# Patient Record
Sex: Female | Born: 1960 | Race: Black or African American | Hispanic: No | Smoking: Never smoker
Health system: Southern US, Community
[De-identification: ages and names within clinical notes are randomized; demographics above are authoritative.]

## PROBLEM LIST (undated history)

## (undated) DIAGNOSIS — E785 Hyperlipidemia, unspecified: Secondary | ICD-10-CM

## (undated) DIAGNOSIS — E119 Type 2 diabetes mellitus without complications: Secondary | ICD-10-CM

## (undated) DIAGNOSIS — I10 Essential (primary) hypertension: Secondary | ICD-10-CM

## (undated) DIAGNOSIS — I639 Cerebral infarction, unspecified: Secondary | ICD-10-CM

## (undated) HISTORY — PX: TUBAL LIGATION: SHX77

## (undated) HISTORY — DX: Essential (primary) hypertension: I10

## (undated) HISTORY — DX: Cerebral infarction, unspecified: I63.9

## (undated) HISTORY — DX: Type 2 diabetes mellitus without complications: E11.9

## (undated) HISTORY — DX: Hyperlipidemia, unspecified: E78.5

---

## 2000-10-19 ENCOUNTER — Encounter: Payer: Self-pay | Admitting: Family Medicine

## 2000-10-19 ENCOUNTER — Ambulatory Visit (HOSPITAL_COMMUNITY): Admission: RE | Admit: 2000-10-19 | Discharge: 2000-10-19 | Payer: Self-pay | Admitting: Family Medicine

## 2002-01-08 ENCOUNTER — Ambulatory Visit (HOSPITAL_COMMUNITY): Admission: RE | Admit: 2002-01-08 | Discharge: 2002-01-08 | Payer: Self-pay | Admitting: Unknown Physician Specialty

## 2002-01-08 ENCOUNTER — Encounter: Payer: Self-pay | Admitting: Family Medicine

## 2004-03-12 ENCOUNTER — Ambulatory Visit: Payer: Self-pay | Admitting: Family Medicine

## 2004-03-12 ENCOUNTER — Ambulatory Visit (HOSPITAL_COMMUNITY): Admission: RE | Admit: 2004-03-12 | Discharge: 2004-03-12 | Payer: Self-pay | Admitting: Family Medicine

## 2004-03-18 ENCOUNTER — Ambulatory Visit: Payer: Self-pay | Admitting: Family Medicine

## 2004-04-16 ENCOUNTER — Ambulatory Visit: Payer: Self-pay | Admitting: Family Medicine

## 2005-12-30 IMAGING — CR DG CHEST 2V
2 series · 2 of 2 positions shown · non-contrast
Comparison: none

HISTORY: Acute bronchitis, cough, congestion

CHEST 2 VIEWS:
No prior exam available for comparison
Normal heart size, mediastinal contours, and vascularity.
Minimal bronchitic changes.
Subsegmental atelectasis lingula.
No infiltrate, effusion, or pneumothorax.

[view not recorded (1 of 2)]
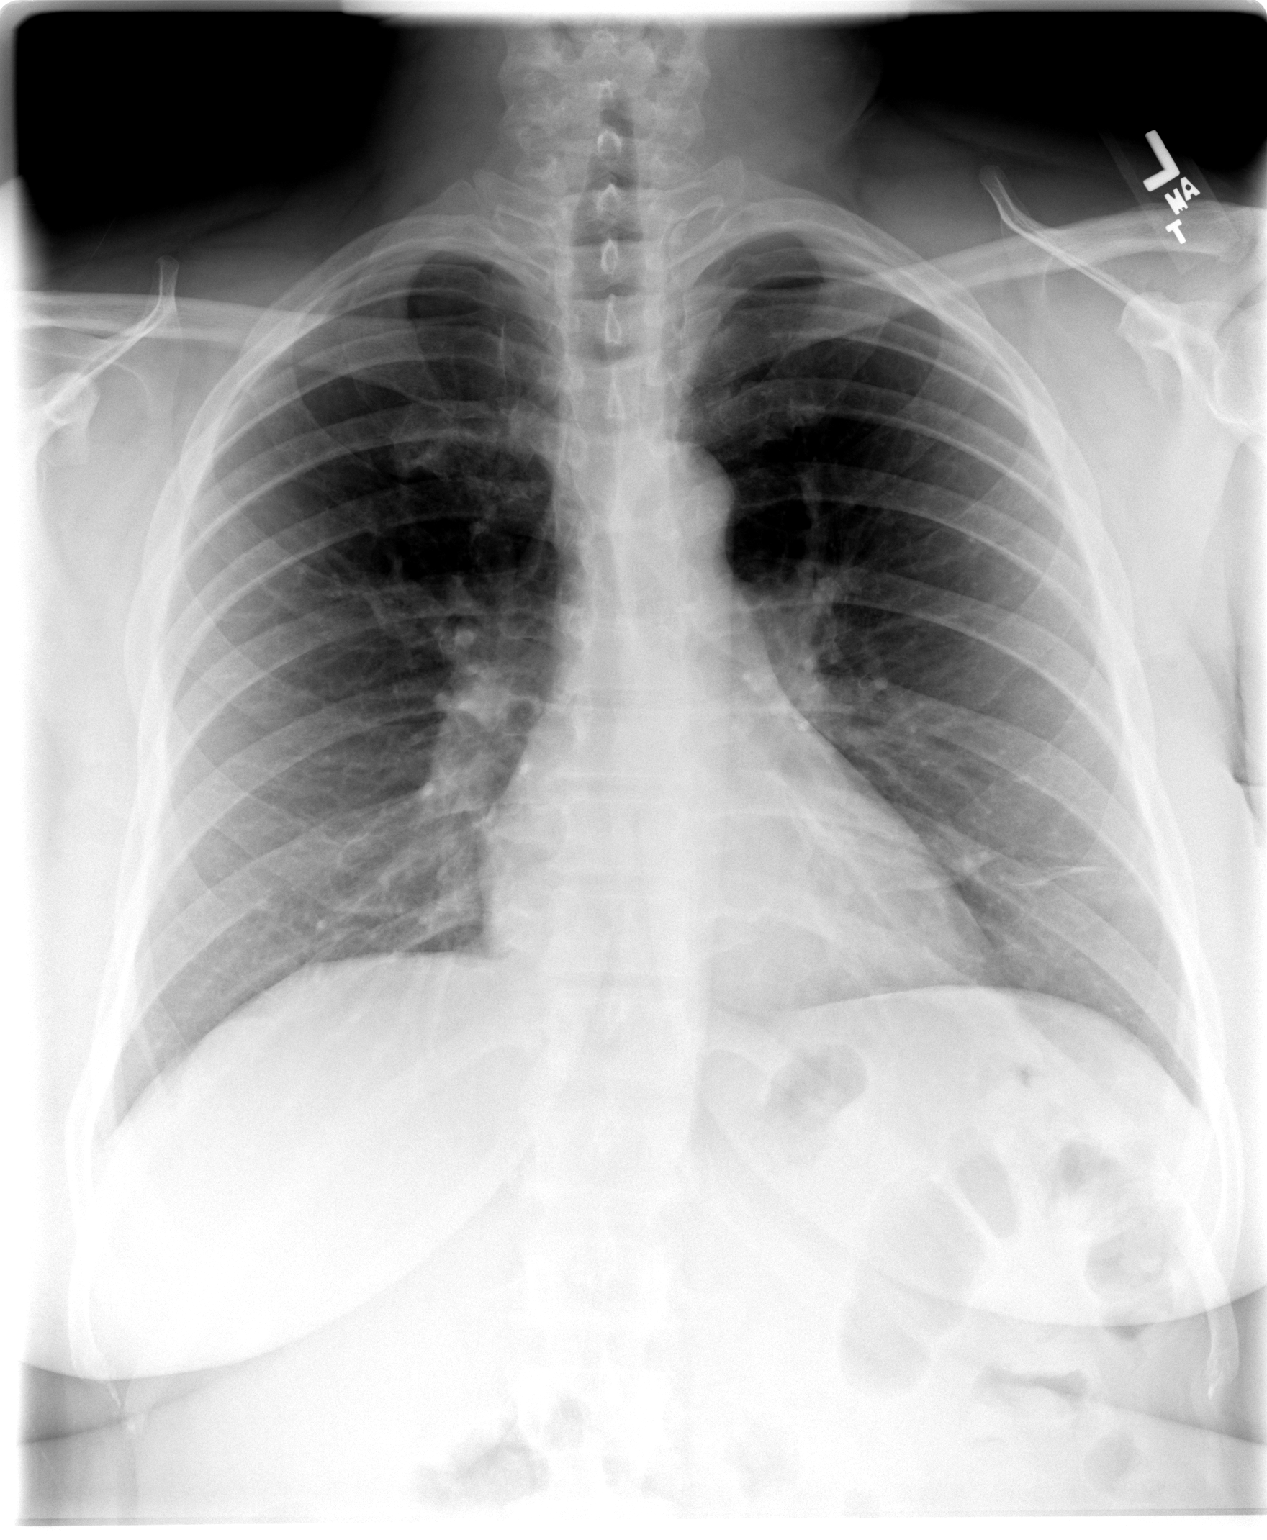

[view not recorded (2 of 2)]
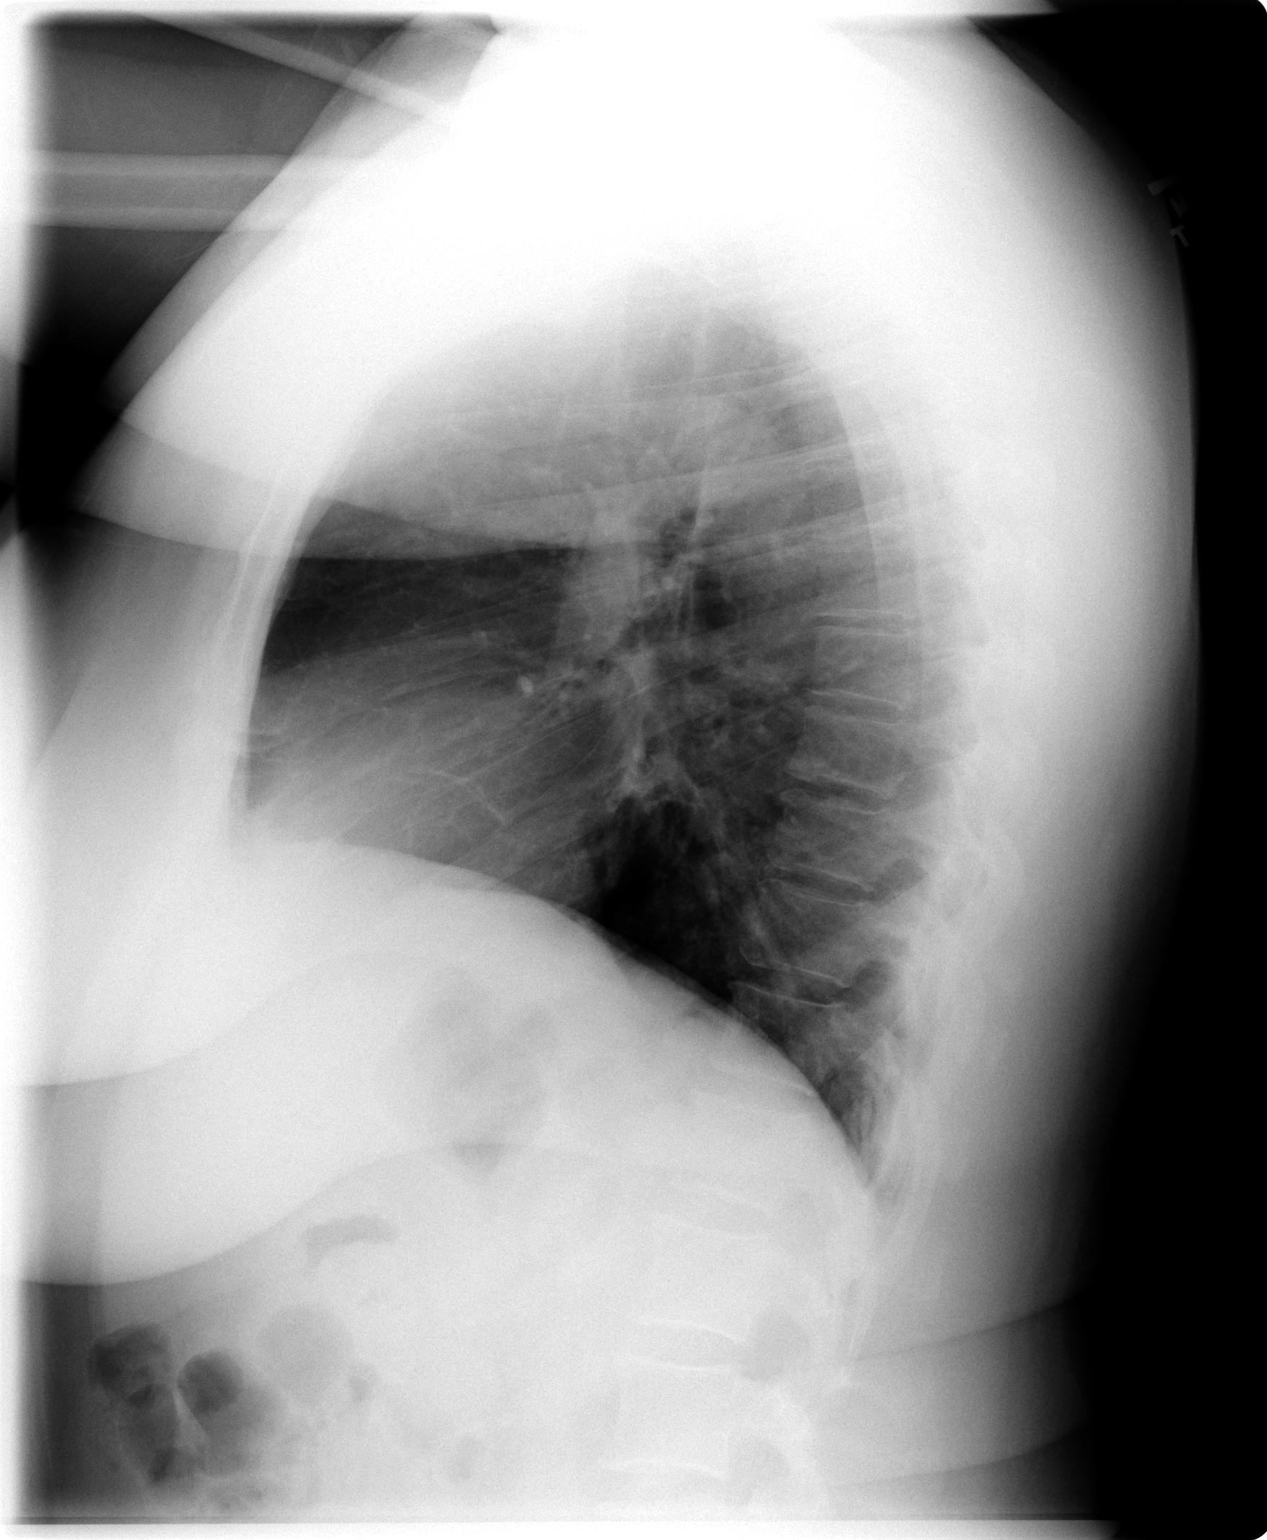

[2 of 2 positions shown; findings below may reference images not displayed]

IMPRESSION: Mild bronchitic changes with minimal subsegmental atelectasis lingula.

## 2007-01-19 ENCOUNTER — Encounter: Payer: Self-pay | Admitting: Family Medicine

## 2010-02-17 NOTE — Letter (Signed)
Summary: RPC chart  RPC chart   Imported By: Curtis Sites 08/26/2009 10:57:23  _____________________________________________________________________  External Attachment:    Type:   Image     Comment:   External Document

## 2020-03-12 DIAGNOSIS — E1165 Type 2 diabetes mellitus with hyperglycemia: Secondary | ICD-10-CM | POA: Diagnosis not present

## 2020-03-12 DIAGNOSIS — E1140 Type 2 diabetes mellitus with diabetic neuropathy, unspecified: Secondary | ICD-10-CM | POA: Diagnosis not present

## 2020-03-12 DIAGNOSIS — R4182 Altered mental status, unspecified: Secondary | ICD-10-CM | POA: Diagnosis not present

## 2020-03-12 DIAGNOSIS — H748X2 Other specified disorders of left middle ear and mastoid: Secondary | ICD-10-CM | POA: Diagnosis not present

## 2020-03-12 DIAGNOSIS — E8729 Other acidosis: Secondary | ICD-10-CM | POA: Insufficient documentation

## 2020-03-12 DIAGNOSIS — H538 Other visual disturbances: Secondary | ICD-10-CM | POA: Diagnosis not present

## 2020-03-12 DIAGNOSIS — H539 Unspecified visual disturbance: Secondary | ICD-10-CM | POA: Diagnosis not present

## 2020-03-12 DIAGNOSIS — R21 Rash and other nonspecific skin eruption: Secondary | ICD-10-CM | POA: Diagnosis not present

## 2020-03-12 DIAGNOSIS — R739 Hyperglycemia, unspecified: Secondary | ICD-10-CM | POA: Diagnosis not present

## 2020-03-12 DIAGNOSIS — I63239 Cerebral infarction due to unspecified occlusion or stenosis of unspecified carotid arteries: Secondary | ICD-10-CM | POA: Diagnosis not present

## 2020-03-12 DIAGNOSIS — E1340 Other specified diabetes mellitus with diabetic neuropathy, unspecified: Secondary | ICD-10-CM | POA: Diagnosis not present

## 2020-03-12 DIAGNOSIS — I10 Essential (primary) hypertension: Secondary | ICD-10-CM | POA: Diagnosis not present

## 2020-03-12 DIAGNOSIS — E872 Acidosis: Secondary | ICD-10-CM | POA: Insufficient documentation

## 2020-03-12 DIAGNOSIS — R29700 NIHSS score 0: Secondary | ICD-10-CM | POA: Diagnosis not present

## 2020-03-12 DIAGNOSIS — R42 Dizziness and giddiness: Secondary | ICD-10-CM | POA: Diagnosis not present

## 2020-03-12 DIAGNOSIS — E041 Nontoxic single thyroid nodule: Secondary | ICD-10-CM | POA: Diagnosis not present

## 2020-03-12 DIAGNOSIS — I639 Cerebral infarction, unspecified: Secondary | ICD-10-CM | POA: Diagnosis not present

## 2020-03-12 DIAGNOSIS — Z20822 Contact with and (suspected) exposure to covid-19: Secondary | ICD-10-CM | POA: Diagnosis not present

## 2020-03-13 DIAGNOSIS — R739 Hyperglycemia, unspecified: Secondary | ICD-10-CM | POA: Diagnosis not present

## 2020-03-13 DIAGNOSIS — E872 Acidosis: Secondary | ICD-10-CM | POA: Diagnosis not present

## 2020-03-13 DIAGNOSIS — I63331 Cerebral infarction due to thrombosis of right posterior cerebral artery: Secondary | ICD-10-CM | POA: Diagnosis not present

## 2020-03-13 DIAGNOSIS — I517 Cardiomegaly: Secondary | ICD-10-CM | POA: Diagnosis not present

## 2020-03-13 DIAGNOSIS — I5189 Other ill-defined heart diseases: Secondary | ICD-10-CM | POA: Diagnosis not present

## 2020-03-13 DIAGNOSIS — I10 Essential (primary) hypertension: Secondary | ICD-10-CM | POA: Diagnosis not present

## 2020-03-14 DIAGNOSIS — I639 Cerebral infarction, unspecified: Secondary | ICD-10-CM | POA: Diagnosis not present

## 2020-03-21 ENCOUNTER — Telehealth: Payer: Self-pay

## 2020-03-21 ENCOUNTER — Ambulatory Visit (INDEPENDENT_AMBULATORY_CARE_PROVIDER_SITE_OTHER): Payer: Federal, State, Local not specified - PPO | Admitting: Nurse Practitioner

## 2020-03-21 ENCOUNTER — Encounter: Payer: Self-pay | Admitting: Nurse Practitioner

## 2020-03-21 ENCOUNTER — Other Ambulatory Visit: Payer: Self-pay

## 2020-03-21 DIAGNOSIS — Z7689 Persons encountering health services in other specified circumstances: Secondary | ICD-10-CM

## 2020-03-21 DIAGNOSIS — I10 Essential (primary) hypertension: Secondary | ICD-10-CM | POA: Diagnosis not present

## 2020-03-21 DIAGNOSIS — E872 Acidosis: Secondary | ICD-10-CM | POA: Diagnosis not present

## 2020-03-21 DIAGNOSIS — Z139 Encounter for screening, unspecified: Secondary | ICD-10-CM

## 2020-03-21 DIAGNOSIS — I639 Cerebral infarction, unspecified: Secondary | ICD-10-CM

## 2020-03-21 DIAGNOSIS — E1169 Type 2 diabetes mellitus with other specified complication: Secondary | ICD-10-CM | POA: Insufficient documentation

## 2020-03-21 DIAGNOSIS — E1165 Type 2 diabetes mellitus with hyperglycemia: Secondary | ICD-10-CM | POA: Diagnosis not present

## 2020-03-21 MED ORDER — DAPAGLIFLOZIN PROPANEDIOL 10 MG PO TABS: 10.0000 mg | ORAL_TABLET | Freq: Every day | ORAL | 1 refills | Status: DC

## 2020-03-21 MED ORDER — CVS GLUCOSE METER TEST STRIPS VI STRP: ORAL_STRIP | 12 refills | Status: DC

## 2020-03-21 MED ORDER — ONETOUCH ULTRASOFT LANCETS MISC: 12 refills | Status: AC

## 2020-03-21 MED ORDER — BLOOD GLUCOSE METER KIT: PACK | 0 refills | Status: AC

## 2020-03-21 MED ORDER — LISINOPRIL 10 MG PO TABS: 10.0000 mg | ORAL_TABLET | Freq: Every day | ORAL | 3 refills | Status: DC

## 2020-03-21 MED ORDER — METFORMIN HCL 500 MG PO TABS: ORAL_TABLET | ORAL | 3 refills | Status: DC

## 2020-03-21 NOTE — Progress Notes (Signed)
New Patient Office Visit  Subjective:  Patient ID: Martha Jensen, female    DOB: 1960-10-29  Age: 60 y.o. MRN: [id number]  CC:  Chief Complaint  Patient presents with  . New Patient (Initial Visit)    Post CVA - 2/23    HPI Martha Jensen presents for new patient visit. No recent PCP. No recent physical or labs from PCP.  She was admitted to [facility] on 03/12/20 for subacute right occipital cortical infarct.  She was also found to have hyperglycemia.  She had SLP evaluation, and no further SLP therapy was required.  PT recommended 3x/wk therapy.  Her A1c was > 16% when drawn at [facility].  Past Medical History:  Diagnosis Date  . Diabetes mellitus without complication ([place])   . Stroke [facility])    Has blurred vision    History reviewed. No pertinent surgical history.  History reviewed. No pertinent family history.  Social History   Socioeconomic History  . Marital status: Divorced    Spouse name: Not on file  . Number of children: Not on file  . Years of education: Not on file  . Highest education level: Not on file  Occupational History  . Occupation: [employer] to [employer]  Tobacco Use  . Smoking status: Never Smoker  . Smokeless tobacco: Never Used  Substance and Sexual Activity  . Alcohol use: Never  . Drug use: Never  . Sexual activity: Not Currently  Other Topics Concern  . Not on file  Social History Narrative  . Not on file   Social Determinants of Health   Financial Resource Strain: Not on file  Food Insecurity: Not on file  Transportation Needs: Not on file  Physical Activity: Not on file  Stress: Not on file  Social Connections: Not on file  Intimate Partner Violence: Not on file    ROS Review of Systems  Constitutional: Negative.   Respiratory: Negative.   Cardiovascular: Negative.   Endocrine: Positive for polydipsia, polyphagia and polyuria.  Neurological: Negative for seizures, facial asymmetry,  weakness, numbness and headaches.       Vision changes after CVA  Psychiatric/Behavioral: Negative.     Objective:   Today's Vitals: BP (!) 175/127   Pulse 94   Temp 98.1 F (36.7 C)   Resp 20   Ht 5' 2"  (1.575 m)   Wt 212 lb (96.2 kg)   SpO2 98%   BMI 38.78 kg/m   Physical Exam Constitutional:      Appearance: She is obese.  Cardiovascular:     Rate and Rhythm: Normal rate and regular rhythm.     Pulses: Normal pulses.     Heart sounds: Normal heart sounds.  Pulmonary:     Effort: Pulmonary effort is normal.     Breath sounds: Normal breath sounds.  Musculoskeletal:        General: Normal range of motion.  Neurological:     General: No focal deficit present.     Mental Status: She is alert and oriented to person, place, and time.     Cranial Nerves: No cranial nerve deficit.     Comments: Vision changes after CVA     Assessment & Plan:   Problem List Items Addressed This Visit      Cardiovascular and Mediastinum   Hypertension, essential    -BP elevated today -Rx. lisinopril      Relevant Medications   aspirin 325 MG tablet   atorvastatin (LIPITOR) 80 MG  tablet   lisinopril (ZESTRIL) 10 MG tablet     Endocrine   Type II diabetes mellitus, uncontrolled ([place])    -referred to endocrinology -she was discharged without diabetic supplies despite her A1c > 16% -Rx. Meter, strips, lancets -CBG 278 today -Rx. farxiga -Rx. Metformin -taking lipitor and started on lisinopril today      Relevant Medications   aspirin 325 MG tablet   atorvastatin (LIPITOR) 80 MG tablet   blood glucose meter kit and supplies   metFORMIN (GLUCOPHAGE) 500 MG tablet   dapagliflozin propanediol (FARXIGA) 10 MG TABS tablet   lisinopril (ZESTRIL) 10 MG tablet   Other Relevant Orders   Ambulatory referral to Ophthalmology     Nervous and Auditory   Occipital infarction [facility])    -recent discharge from [facility] for CVA -has blurred vision; unsure if this is all CVA  related, or possibly related to her diabetes (A1c was > 16%) -referral to neurology, ophthalmology, PT, OT -has SLP eval while admitted, and there were no SLP deficits noted      Relevant Orders   Ambulatory referral to Ophthalmology   Ambulatory referral to Neurology   Ambulatory referral to Endocrinology   Ambulatory referral to Physical Therapy   Ambulatory referral to Occupational Therapy     Other   Encounter to establish care    -no recent PCP -briefly reviewed hospital notes from [facility]      Relevant Orders   CBC with Differential/Platelet   CMP14+EGFR   HCV Ab w/Rflx to Verification   Lipid Panel With LDL/HDL Ratio   Microalbumin / creatinine urine ratio   Screening due    -will screen for HCV with next set of labs      Relevant Orders   Lipid Panel With LDL/HDL Ratio      Outpatient Encounter Medications as of 03/21/2020  Medication Sig  . aspirin 325 MG tablet Take 325 mg by mouth daily.  Marland Kitchen atorvastatin (LIPITOR) 80 MG tablet Take 80 mg by mouth daily.  . blood glucose meter kit and supplies Dispense based on patient and insurance preference. Use up to four times daily as directed. (FOR ICD-10 E10.9, E11.9).  . dapagliflozin propanediol (FARXIGA) 10 MG TABS tablet Take 1 tablet (10 mg total) by mouth daily before breakfast.  . lisinopril (ZESTRIL) 10 MG tablet Take 1 tablet (10 mg total) by mouth daily.  . metFORMIN (GLUCOPHAGE) 500 MG tablet Take 1 tablet PO daily for 1 week, then take 1 tablet PO BID   No facility-administered encounter medications on file as of 03/21/2020.    Follow-up: Return in about 2 weeks (around 04/04/2020) for Physical exam.   Noreene Larsson, NP

## 2020-03-21 NOTE — Assessment & Plan Note (Signed)
-  BP elevated today -Rx. lisinopril

## 2020-03-21 NOTE — Telephone Encounter (Signed)
fmla forms received  Copied Noted Sleeved

## 2020-03-21 NOTE — Assessment & Plan Note (Addendum)
-  referred to endocrinology -she was discharged without diabetic supplies despite her A1c > 16% -Rx. Meter, strips, lancets -CBG 278 today -Rx. farxiga -Rx. Metformin -taking lipitor and started on lisinopril today

## 2020-03-21 NOTE — Assessment & Plan Note (Signed)
-  recent discharge from [facility] for CVA -has blurred vision; unsure if this is all CVA related, or possibly related to her diabetes (A1c was > 16%) -referral to neurology, ophthalmology, PT, OT -has SLP eval while admitted, and there were no SLP deficits noted

## 2020-03-21 NOTE — Assessment & Plan Note (Signed)
-  no recent PCP -briefly reviewed hospital notes from [facility]

## 2020-03-21 NOTE — Assessment & Plan Note (Signed)
-  will screen for HCV with next set of labs

## 2020-03-21 NOTE — Addendum Note (Signed)
Addended by: Lonn Georgia on: 03/21/2020 11:25 AM   Modules accepted: Orders

## 2020-03-21 NOTE — Patient Instructions (Signed)
We will meet back up in 2 weeks for a physical exam.  If you are fasting today, we can draw labs today. If you have eaten, we can drawn them at the time of your next appointment.  I started you on several medications today.    Lisinopril is for elevated BP. We don't want to decrease your BP too fast, so I started you on a small dose. This medication will also protect your kidneys from diabetes.  For diabetes, I started you on metformin and farxiga. The endocrinologist may change these medications to injectable insulin.  I also sent in a prescription for a meter, strips, and lancets for you to check your own blood sugar. You should check this once per day prior to seeing endocrinology.  Record your blood sugars and we will review them in 2 weeks.  I sent in referrals to PT, OT, endocrinology, neurology, and ophthalmology.

## 2020-03-23 LAB — CBC WITH DIFFERENTIAL/PLATELET
Basophils Absolute: 0 10*3/uL (ref 0.0–0.2)
Basos: 0 %
EOS (ABSOLUTE): 0.1 10*3/uL (ref 0.0–0.4)
Eos: 1 %
Hematocrit: 43.3 % (ref 34.0–46.6)
Hemoglobin: 13.8 g/dL (ref 11.1–15.9)
Immature Grans (Abs): 0 10*3/uL (ref 0.0–0.1)
Immature Granulocytes: 1 %
Lymphocytes Absolute: 2.3 10*3/uL (ref 0.7–3.1)
Lymphs: 34 %
MCH: 25.9 pg — ABNORMAL LOW (ref 26.6–33.0)
MCHC: 31.9 g/dL (ref 31.5–35.7)
MCV: 81 fL (ref 79–97)
Monocytes Absolute: 0.7 10*3/uL (ref 0.1–0.9)
Monocytes: 11 %
Neutrophils Absolute: 3.7 10*3/uL (ref 1.4–7.0)
Neutrophils: 53 %
Platelets: 234 10*3/uL (ref 150–450)
RBC: 5.32 x10E6/uL — ABNORMAL HIGH (ref 3.77–5.28)
RDW: 14.5 % (ref 11.7–15.4)
WBC: 6.8 10*3/uL (ref 3.4–10.8)

## 2020-03-23 LAB — LIPID PANEL WITH LDL/HDL RATIO
Cholesterol, Total: 162 mg/dL (ref 100–199)
HDL: 53 mg/dL (ref >39)
LDL Chol Calc (NIH): 93 mg/dL (ref 0–99)
LDL/HDL Ratio: 1.8 ratio (ref 0.0–3.2)
Triglycerides: 85 mg/dL (ref 0–149)
VLDL Cholesterol Cal: 16 mg/dL (ref 5–40)

## 2020-03-23 LAB — HCV INTERPRETATION

## 2020-03-23 LAB — CMP14+EGFR
ALT: 34 IU/L — ABNORMAL HIGH (ref 0–32)
AST: 28 IU/L (ref 0–40)
Albumin/Globulin Ratio: 1.5 (ref 1.2–2.2)
Albumin: 4.1 g/dL (ref 3.8–4.8)
Alkaline Phosphatase: 75 IU/L (ref 44–121)
BUN/Creatinine Ratio: 7 — ABNORMAL LOW (ref 12–28)
BUN: 6 mg/dL — ABNORMAL LOW (ref 8–27)
Bilirubin Total: 0.3 mg/dL (ref 0.0–1.2)
CO2: 22 mmol/L (ref 20–29)
Calcium: 9.3 mg/dL (ref 8.7–10.3)
Chloride: 100 mmol/L (ref 96–106)
Creatinine, Ser: 0.82 mg/dL (ref 0.57–1.00)
Globulin, Total: 2.7 g/dL (ref 1.5–4.5)
Glucose: 295 mg/dL — ABNORMAL HIGH (ref 65–99)
Potassium: 3.6 mmol/L (ref 3.5–5.2)
Sodium: 137 mmol/L (ref 134–144)
Total Protein: 6.8 g/dL (ref 6.0–8.5)
eGFR: 81 mL/min/{1.73_m2} (ref >59)

## 2020-03-23 LAB — MICROALBUMIN / CREATININE URINE RATIO
Creatinine, Urine: 190.7 mg/dL
Microalb/Creat Ratio: 38 mg/g creat — ABNORMAL HIGH (ref 0–29)
Microalbumin, Urine: 72.5 ug/mL

## 2020-03-23 LAB — HCV AB W/RFLX TO VERIFICATION: HCV Ab: <0.1 s/co ratio (ref 0.0–0.9)

## 2020-03-24 NOTE — Progress Notes (Signed)
Her blood sugar is elevated, but other than that labs look OK. We will discuss them at the appointment on 04/04/20.

## 2020-04-04 ENCOUNTER — Other Ambulatory Visit: Payer: Self-pay

## 2020-04-04 ENCOUNTER — Encounter (INDEPENDENT_AMBULATORY_CARE_PROVIDER_SITE_OTHER): Payer: Self-pay | Admitting: *Deleted

## 2020-04-04 ENCOUNTER — Ambulatory Visit (INDEPENDENT_AMBULATORY_CARE_PROVIDER_SITE_OTHER): Payer: Federal, State, Local not specified - PPO | Admitting: Nurse Practitioner

## 2020-04-04 ENCOUNTER — Encounter: Payer: Self-pay | Admitting: Nurse Practitioner

## 2020-04-04 VITALS — BP 175/109 | HR 100 | Temp 98.5°F | Resp 20 | Ht 62.0 in | Wt 210.0 lb

## 2020-04-04 DIAGNOSIS — Z23 Encounter for immunization: Secondary | ICD-10-CM

## 2020-04-04 DIAGNOSIS — Z139 Encounter for screening, unspecified: Secondary | ICD-10-CM

## 2020-04-04 DIAGNOSIS — E1165 Type 2 diabetes mellitus with hyperglycemia: Secondary | ICD-10-CM

## 2020-04-04 DIAGNOSIS — Z1231 Encounter for screening mammogram for malignant neoplasm of breast: Secondary | ICD-10-CM

## 2020-04-04 DIAGNOSIS — I639 Cerebral infarction, unspecified: Secondary | ICD-10-CM

## 2020-04-04 DIAGNOSIS — I10 Essential (primary) hypertension: Secondary | ICD-10-CM

## 2020-04-04 MED ORDER — LISINOPRIL 20 MG PO TABS: 20.0000 mg | ORAL_TABLET | Freq: Every day | ORAL | 1 refills | Status: DC

## 2020-04-04 NOTE — Patient Instructions (Addendum)
Please contact [facility] at [phone] to set up an appointment.  We will meet up in 2 weeks for BP check, and we will do the PAP smear that day.  We are trying to help with the transportation issue.

## 2020-04-04 NOTE — Assessment & Plan Note (Signed)
-  INCREASED lisinopril to 20 mg daily d/t elevated BP -if no improvement at next visit will consider amlodipine

## 2020-04-04 NOTE — Assessment & Plan Note (Signed)
-  has blurred vision still; could be related to diabetic eye changes -needs appt with ophthalmology, neuro, PT/OT

## 2020-04-04 NOTE — Progress Notes (Signed)
Established Patient Office Visit  Subjective:  Patient ID: Martha Jensen, female    DOB: December 02, 1960  Age: 60 y.o. MRN: [id number]  CC:  Chief Complaint  Patient presents with  . Annual Exam    HPI Martha Jensen presents for physical exam.  On 03/12/20, she was admitted to [facility]  for subacute right occipital cortical infarct.  She was also found to have hyperglycemia.  She had SLP evaluation, and no further SLP therapy was required.  PT recommended 3x/wk therapy. At her last OV, she was referred to ophthalmology, neuro, PT/OT. Pt refused PT and OT. Neurology called her 2x but she has not been scheduled. She states that she can't get to her appointments because she is unable to drive.  Her A1c was > 16% when drawn at [facility]. At her OV on 03/21/20, she was started on metformin and farxiga. Endocrinology says ready to be scheduled. She is having difficulty getting transportation to her appointments. She states that her blood sugars have run from 100-146, so that is much improved.  Past Medical History:  Diagnosis Date  . Diabetes mellitus without complication ([place])   . Stroke [facility])    Has blurred vision    History reviewed. No pertinent surgical history.  History reviewed. No pertinent family history.  Social History   Socioeconomic History  . Marital status: Divorced    Spouse name: Not on file  . Number of children: Not on file  . Years of education: Not on file  . Highest education level: Not on file  Occupational History  . Occupation: [employer] to [employer]  Tobacco Use  . Smoking status: Never Smoker  . Smokeless tobacco: Never Used  Substance and Sexual Activity  . Alcohol use: Never  . Drug use: Never  . Sexual activity: Not Currently  Other Topics Concern  . Not on file  Social History Narrative  . Not on file   Social Determinants of Health   Financial Resource Strain: Not on file  Food Insecurity: Not on file   Transportation Needs: Not on file  Physical Activity: Not on file  Stress: Not on file  Social Connections: Not on file  Intimate Partner Violence: Not on file    Outpatient Medications Prior to Visit  Medication Sig Dispense Refill  . aspirin 325 MG tablet Take 325 mg by mouth daily.    Marland Kitchen atorvastatin (LIPITOR) 80 MG tablet Take 80 mg by mouth daily.    . blood glucose meter kit and supplies Dispense based on patient and insurance preference. Use up to four times daily as directed. (FOR ICD-10 E10.9, E11.9). 1 each 0  . dapagliflozin propanediol (FARXIGA) 10 MG TABS tablet Take 1 tablet (10 mg total) by mouth daily before breakfast. 90 tablet 1  . glucose blood ([employer] GLUCOSE METER TEST STRIPS) test strip Use as instructed, up to 4 times per day 100 each 12  . Lancets (ONETOUCH ULTRASOFT) lancets Use as instructed, can use up to 4 times per day. 100 each 12  . metFORMIN (GLUCOPHAGE) 500 MG tablet Take 1 tablet PO daily for 1 week, then take 1 tablet PO BID 180 tablet 3  . lisinopril (ZESTRIL) 10 MG tablet Take 1 tablet (10 mg total) by mouth daily. 90 tablet 3   No facility-administered medications prior to visit.    No Known Allergies  ROS Review of Systems  Constitutional: Negative.   HENT: Negative.   Eyes: Positive for visual disturbance.  Blurred vision since CVA  Respiratory: Negative.   Cardiovascular: Negative.   Gastrointestinal: Negative.   Endocrine: Negative.   Genitourinary: Negative.   Musculoskeletal: Negative.   Skin: Negative.   Allergic/Immunologic: Negative.   Neurological: Negative.   Hematological: Negative.   Psychiatric/Behavioral: Negative.       Objective:    Physical Exam Constitutional:      Appearance: She is obese.  HENT:     Head: Normocephalic and atraumatic.     Right Ear: Tympanic membrane, ear canal and external ear normal.     Left Ear: Tympanic membrane, ear canal and external ear normal.     Nose: Nose normal.      Mouth/Throat:     Mouth: Mucous membranes are moist.     Pharynx: Oropharynx is clear.  Eyes:     Extraocular Movements: Extraocular movements intact.     Conjunctiva/sclera: Conjunctivae normal.     Pupils: Pupils are equal, round, and reactive to light.  Cardiovascular:     Rate and Rhythm: Normal rate and regular rhythm.     Pulses: Normal pulses.     Heart sounds: Normal heart sounds.  Pulmonary:     Effort: Pulmonary effort is normal.     Breath sounds: Normal breath sounds.  Abdominal:     General: Abdomen is flat. Bowel sounds are normal.     Palpations: Abdomen is soft.  Musculoskeletal:        General: Normal range of motion.     Cervical back: Normal range of motion and neck supple.  Skin:    General: Skin is warm and dry.     Capillary Refill: Capillary refill takes less than 2 seconds.  Neurological:     General: No focal deficit present.     Mental Status: She is alert and oriented to person, place, and time.     Cranial Nerves: No cranial nerve deficit.     Sensory: No sensory deficit.     Motor: No weakness.     Coordination: Coordination normal.     Gait: Gait normal.     Comments: Left DTR 1+ < R (2+)  Psychiatric:        Mood and Affect: Mood normal.        Behavior: Behavior normal.        Thought Content: Thought content normal.        Judgment: Judgment normal.     BP (!) 175/109   Pulse 100   Temp 98.5 F (36.9 C)   Resp 20   Ht _0  (1.575 m)   Wt 210 lb (95.3 kg)   SpO2 97%   BMI 38.41 kg/m  Wt Readings from Last 3 Encounters:  04/04/20 210 lb (95.3 kg)  03/21/20 212 lb (96.2 kg)     Health Maintenance Due  Topic Date Due  . PNEUMOCOCCAL POLYSACCHARIDE VACCINE AGE 9-64 HIGH RISK  Never done  . COVID-19 Vaccine (1) Never done  . FOOT EXAM  Never done  . OPHTHALMOLOGY EXAM  Never done  . HIV Screening  Never done  . TETANUS/TDAP  Never done  . PAP SMEAR-Modifier  Never done  . COLONOSCOPY (Pts 45-60yr Insurance coverage will  need to be confirmed)  Never done  . MAMMOGRAM  Never done    There are no preventive care reminders to display for this patient.  No results found for: TSH Lab Results  Component Value Date   WBC 6.8 03/21/2020   HGB 13.8 03/21/2020  HCT 43.3 03/21/2020   MCV 81 03/21/2020   PLT 234 03/21/2020   Lab Results  Component Value Date   NA 137 03/21/2020   K 3.6 03/21/2020   CO2 22 03/21/2020   GLUCOSE 295 (H) 03/21/2020   BUN 6 (L) 03/21/2020   CREATININE 0.82 03/21/2020   BILITOT 0.3 03/21/2020   ALKPHOS 75 03/21/2020   AST 28 03/21/2020   ALT 34 (H) 03/21/2020   PROT 6.8 03/21/2020   ALBUMIN 4.1 03/21/2020   CALCIUM 9.3 03/21/2020   Lab Results  Component Value Date   CHOL 162 03/21/2020   Lab Results  Component Value Date   HDL 53 03/21/2020   Lab Results  Component Value Date   LDLCALC 93 03/21/2020   Lab Results  Component Value Date   TRIG 85 03/21/2020   No results found for: CHOLHDL No results found for: HGBA1C    Assessment & Plan:   Problem List Items Addressed This Visit      Cardiovascular and Mediastinum   Hypertension, essential    -INCREASED lisinopril to 20 mg daily d/t elevated BP -if no improvement at next visit will consider amlodipine      Relevant Medications   lisinopril (ZESTRIL) 20 MG tablet     Endocrine   Type II diabetes mellitus, uncontrolled ([place])    -she reports home blood sugars generally around 100, no readings above 150 -this is well controlled per her home readings -continue farxiga and metformin      Relevant Medications   lisinopril (ZESTRIL) 20 MG tablet     Nervous and Auditory   Occipital infarction ([place])    -has blurred vision still; could be related to diabetic eye changes -needs appt with ophthalmology, neuro, PT/OT         Other   Screening due   Relevant Orders   Ambulatory referral to Gastroenterology    Other Visit Diagnoses    Screening mammogram for breast cancer    -  Primary    Relevant Orders   MM Digital Screening      Meds ordered this encounter  Medications  . lisinopril (ZESTRIL) 20 MG tablet    Sig: Take 1 tablet (20 mg total) by mouth daily.    Dispense:  30 tablet    Refill:  1    Follow-up: Return in about 2 weeks (around 04/18/2020) for BP check.    Noreene Larsson, NP

## 2020-04-04 NOTE — Addendum Note (Signed)
Addended by: Lonn Georgia on: 04/04/2020 11:13 AM   Modules accepted: Orders

## 2020-04-04 NOTE — Assessment & Plan Note (Signed)
-  she reports home blood sugars generally around 100, no readings above 150 -this is well controlled per her home readings -continue farxiga and metformin

## 2020-04-06 DIAGNOSIS — I10 Essential (primary) hypertension: Secondary | ICD-10-CM

## 2020-04-07 ENCOUNTER — Encounter: Payer: Self-pay | Admitting: Nurse Practitioner

## 2020-04-07 NOTE — Telephone Encounter (Signed)
COMPLETE

## 2020-04-08 ENCOUNTER — Encounter: Payer: Self-pay | Admitting: Neurology

## 2020-04-08 ENCOUNTER — Ambulatory Visit: Payer: Federal, State, Local not specified - PPO | Admitting: "Endocrinology

## 2020-04-08 ENCOUNTER — Ambulatory Visit: Payer: Federal, State, Local not specified - PPO | Admitting: Neurology

## 2020-04-08 VITALS — BP 150/95 | HR 85 | Ht 62.0 in | Wt 212.2 lb

## 2020-04-08 DIAGNOSIS — H539 Unspecified visual disturbance: Secondary | ICD-10-CM | POA: Diagnosis not present

## 2020-04-08 DIAGNOSIS — I639 Cerebral infarction, unspecified: Secondary | ICD-10-CM | POA: Diagnosis not present

## 2020-04-08 MED ORDER — ASPIRIN EC 81 MG PO TBEC: 81.0000 mg | DELAYED_RELEASE_TABLET | Freq: Every day | ORAL | 11 refills | Status: DC

## 2020-04-08 NOTE — Patient Instructions (Signed)
I had a long d/w patient and her daughter about her recent cryptogenic stroke and occipital infarct, risk for recurrent stroke/TIAs, personally independently reviewed imaging studies and stroke evaluation results and answered questions.Continue aspirin but reduce the dose to 81 mg daily for secondary stroke prevention and maintain strict control of hypertension with blood pressure goal below 130/90, diabetes with hemoglobin A1c goal below 6.5% and lipids with LDL cholesterol goal below 70 mg/dL. I also advised the patient to eat a healthy diet with plenty of whole grains, cereals, fruits and vegetables, exercise regularly and maintain ideal body weight .check follow-up lipid profile, hemoglobin A1c, TSH, B12 and vitamin D levels.  Recommend outpatient TEE and if negative loop recorder for paroxysmal A. fib.  Also check outpatient TCD bubble study for PFO.  Patient may also consider possible participation in the [place] trial for stroke prevention and will be given information to review and decide.  I also advised her not to drive till her peripheral vision loss improves.  Followup in the future with me in 3 months or call earlier if necessary  Stroke Prevention Some medical conditions and behaviors are associated with a higher chance of having a stroke. You can help prevent a stroke by making nutrition, lifestyle, and other changes, including managing any medical conditions you may have. What nutrition changes can be made?  Eat healthy foods. You can do this by: ? Choosing foods high in fiber, such as fresh fruits and vegetables and whole grains. ? Eating at least 5 or more servings of fruits and vegetables a day. Try to fill half of your plate at each meal with fruits and vegetables. ? Choosing lean protein foods, such as lean cuts of meat, poultry without skin, fish, tofu, beans, and nuts. ? Eating low-fat dairy products. ? Avoiding foods that are high in salt (sodium). This can help lower blood  pressure. ? Avoiding foods that have saturated fat, trans fat, and cholesterol. This can help prevent high cholesterol. ? Avoiding processed and premade foods.  Follow your health care provider's specific guidelines for losing weight, controlling high blood pressure (hypertension), lowering high cholesterol, and managing diabetes. These may include: ? Reducing your daily calorie intake. ? Limiting your daily sodium intake to 1,500 milligrams (mg). ? Using only healthy fats for cooking, such as olive oil, canola oil, or sunflower oil. ? Counting your daily carbohydrate intake.   What lifestyle changes can be made?  Maintain a healthy weight. Talk to your health care provider about your ideal weight.  Get at least 30 minutes of moderate physical activity at least 5 days a week. Moderate activity includes brisk walking, biking, and swimming.  Do not use any products that contain nicotine or tobacco, such as cigarettes and e-cigarettes. If you need help quitting, ask your health care provider. It may also be helpful to avoid exposure to secondhand smoke.  Limit alcohol intake to no more than 1 drink a day for nonpregnant women and 2 drinks a day for men. One drink equals 12 oz of beer, 5 oz of wine, or 1 oz of hard liquor.  Stop any illegal drug use.  Avoid taking birth control pills. Talk to your health care provider about the risks of taking birth control pills if: ? You are over 33 years old. ? You smoke. ? You get migraines. ? You have ever had a blood clot. What other changes can be made?  Manage your cholesterol levels. ? Eating a healthy diet is important for  preventing high cholesterol. If cholesterol cannot be managed through diet alone, you may also need to take medicines. ? Take any prescribed medicines to control your cholesterol as told by your health care provider.  Manage your diabetes. ? Eating a healthy diet and exercising regularly are important parts of managing your  blood sugar. If your blood sugar cannot be managed through diet and exercise, you may need to take medicines. ? Take any prescribed medicines to control your diabetes as told by your health care provider.  Control your hypertension. ? To reduce your risk of stroke, try to keep your blood pressure below 130/80. ? Eating a healthy diet and exercising regularly are an important part of controlling your blood pressure. If your blood pressure cannot be managed through diet and exercise, you may need to take medicines. ? Take any prescribed medicines to control hypertension as told by your health care provider. ? Ask your health care provider if you should monitor your blood pressure at home. ? Have your blood pressure checked every year, even if your blood pressure is normal. Blood pressure increases with age and some medical conditions.  Get evaluated for sleep disorders (sleep apnea). Talk to your health care provider about getting a sleep evaluation if you snore a lot or have excessive sleepiness.  Take over-the-counter and prescription medicines only as told by your health care provider. Aspirin or blood thinners (antiplatelets or anticoagulants) may be recommended to reduce your risk of forming blood clots that can lead to stroke.  Make sure that any other medical conditions you have, such as atrial fibrillation or atherosclerosis, are managed. What are the warning signs of a stroke? The warning signs of a stroke can be easily remembered as BEFAST.  B is for balance. Signs include: ? Dizziness. ? Loss of balance or coordination. ? Sudden trouble walking.  E is for eyes. Signs include: ? A sudden change in vision. ? Trouble seeing.  F is for face. Signs include: ? Sudden weakness or numbness of the face. ? The face or eyelid drooping to one side.  A is for arms. Signs include: ? Sudden weakness or numbness of the arm, usually on one side of the body.  S is for speech. Signs  include: ? Trouble speaking (aphasia). ? Trouble understanding.  T is for time. ? These symptoms may represent a serious problem that is an emergency. Do not wait to see if the symptoms will go away. Get medical help right away. Call your local emergency services (911 in the [place] Do not drive yourself to the hospital.  Other signs of stroke may include: ? A sudden, severe headache with no known cause. ? Nausea or vomiting. ? Seizure. Where to find more information For more information, visit:  American Stroke Association: www.strokeassociation.org  National Stroke Association: www.stroke.org Summary  You can prevent a stroke by eating healthy, exercising, not smoking, limiting alcohol intake, and managing any medical conditions you may have.  Do not use any products that contain nicotine or tobacco, such as cigarettes and e-cigarettes. If you need help quitting, ask your health care provider. It may also be helpful to avoid exposure to secondhand smoke.  Remember BEFAST for warning signs of stroke. Get help right away if you or a loved one has any of these signs. This information is not intended to replace advice given to you by your health care provider. Make sure you discuss any questions you have with your health care provider. Document Revised: 12/17/2016  Document Reviewed: 02/10/2016 Elsevier Patient Education  [place].

## 2020-04-08 NOTE — Progress Notes (Signed)
[facility] [address] [place]. [place] [address] [phone]       OFFICE CONSULT NOTE  Martha Jensen Date of Birth:  12/19/1960 Medical Record Number:  [id number]   Referring MD: Demetrius Revel, NP  Reason for Referral: Stroke HPI: Martha Jensen is a 60 year old African-American lady seen today for initial office consultation visit for stroke.  History is obtained from the patient and review of electronic medical records in [place].  I reviewed pertinent imaging results the actual films were not available for review today.  She has past medical history for diabetes, hypertension, hyperlipidemia who was admitted to [facility] on 03/12/2020 with sudden onset of visual disturbance which she described as disco lights type of vision with her body summitted restlessly being on fire from the armpits to the bottom of the toes.  These episodes lasted a few minutes to a maximum of 10 minutes and occurred several times.  She denied any accompanying headache slurred speech extremity weakness numbness.  She had an MRI scan of the brain done which showed right occipital cortical infarct.  CT angiogram of the brain and neck were done which showed no significant large vessel intracranial extracranial stenosis.  2D echo showed ejection fraction of 65 to 70% without cardiac source of embolism.  LDL cholesterol is elevated 169 mg percent.  Hemoglobin A1c report could not be obtained by me but the patient told me it was elevated at 16.  She was started on aspirin 325 mg daily as well as Lipitor 80 mg.  She states the visual disturbances have now resolved but she has noticed slight peripheral vision loss on the left which has persisted.  Patient had Covid infection in January 28 and had just gone to work 10 days later when she started not feeling well and her symptoms gradually worsened then she developed his visual symptoms.  Patient is currently tolerating aspirin well without  upset stomach, bruising or bleeding.  Her blood pressure has been difficult to control and primary care physician increase her medications yesterday and yet it is 150/95 today in my office.  Her fasting sugars are doing better now medication change.  She is tolerating Lipitor well without muscle aches and pains.  She denies any prior history of strokes TIA seizures migraines or significant neurological problems.  There is no family history of stroke.  ROS:   14 system review of systems is positive for vision difficulties, warmth feeling, blurred vision and all other systems negative  PMH:  Past Medical History:  Diagnosis Date  . Diabetes mellitus without complication ([place])   . Stroke [facility])    Has blurred vision    Social History:  Social History   Socioeconomic History  . Marital status: Divorced    Spouse name: Not on file  . Number of children: Not on file  . Years of education: Not on file  . Highest education level: Not on file  Occupational History  . Occupation: [employer]    Comment: full time  Tobacco Use  . Smoking status: Never Smoker  . Smokeless tobacco: Never Used  Substance and Sexual Activity  . Alcohol use: Never  . Drug use: Never  . Sexual activity: Not Currently  Other Topics Concern  . Not on file  Social History Narrative   Lives alone   Right Handed   Drinks caffeine 3xweek   Social Determinants of Health   Financial Resource Strain: Not on  file  Food Insecurity: Not on file  Transportation Needs: Not on file  Physical Activity: Not on file  Stress: Not on file  Social Connections: Not on file  Intimate Partner Violence: Not on file    Medications:   Current Outpatient Medications on File Prior to Visit  Medication Sig Dispense Refill  . atorvastatin (LIPITOR) 80 MG tablet Take 80 mg by mouth daily.    . blood glucose meter kit and supplies Dispense based on patient and insurance preference. Use up to  four times daily as directed. (FOR ICD-10 E10.9, E11.9). 1 each 0  . dapagliflozin propanediol (FARXIGA) 10 MG TABS tablet Take 1 tablet (10 mg total) by mouth daily before breakfast. 90 tablet 1  . glucose blood ([employer] GLUCOSE METER TEST STRIPS) test strip Use as instructed, up to 4 times per day 100 each 12  . Lancets (ONETOUCH ULTRASOFT) lancets Use as instructed, can use up to 4 times per day. 100 each 12  . lisinopril (ZESTRIL) 20 MG tablet Take 1 tablet (20 mg total) by mouth daily. 30 tablet 1  . metFORMIN (GLUCOPHAGE) 500 MG tablet Take 1 tablet PO daily for 1 week, then take 1 tablet PO BID 180 tablet 3   No current facility-administered medications on file prior to visit.    Allergies:  No Known Allergies  Physical Exam General: Mildly obese middle-aged African-American lady, seated, in no evident distress Head: head normocephalic and atraumatic.   Neck: supple with no carotid or supraclavicular bruits Cardiovascular: regular rate and rhythm, no murmurs Musculoskeletal: no deformity Skin:  no rash/petichiae Vascular:  Normal pulses all extremities  Neurologic Exam Mental Status: Awake and fully alert. Oriented to place and time. Recent and remote memory intact. Attention span, concentration and fund of knowledge appropriate. Mood and affect appropriate.  Diminished recall 2/3.  Able to name only 8 animals which can walk on 4 legs. Cranial Nerves: Fundoscopic exam reveals sharp disc margins. Pupils equal, briskly reactive to light. Extraocular movements full without nystagmus. Visual fields show partial left homonymous hemianopsia to confrontation. Hearing intact. Facial sensation intact. Face, tongue, palate moves normally and symmetrically.  Motor: Normal bulk and tone. Normal strength in all tested extremity muscles. Sensory.: intact to touch , pinprick , position and vibratory sensation.  Coordination: Rapid alternating movements normal in all extremities. Finger-to-nose and  heel-to-shin performed accurately bilaterally. Gait and Station: Arises from chair without difficulty. Stance is normal. Gait demonstrates normal stride length and balance . Able to heel, toe and tandem walk without difficulty.  Reflexes: 1+ and symmetric. Toes downgoing.   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  Vascular risk factors of diabetes, hypertension, hyperlipidemia and obesity     PLAN: I had a long d/w patient and her daughter about her recent cryptogenic stroke and occipital infarct, risk for recurrent stroke/TIAs, personally independently reviewed imaging studies and stroke evaluation results and answered questions.Continue aspirin but reduce the dose to 81 mg daily for secondary stroke prevention and maintain strict control of hypertension with blood pressure goal below 130/90, diabetes with hemoglobin A1c goal below 6.5% and lipids with LDL cholesterol goal below 70 mg/dL. I also advised the patient to eat a healthy diet with plenty of whole grains, cereals, fruits and vegetables, exercise regularly and maintain ideal body weight .check follow-up lipid profile, hemoglobin A1c, TSH, B12 and vitamin D levels.  Recommend outpatient TEE and  if negative loop recorder for paroxysmal A. fib.  Also check outpatient TCD bubble study for PFO.  Patient may also consider possible participation in the [place] trial for stroke prevention and will be given information to review and decide.  I also advised her not to drive till her peripheral vision loss improves.  Followup in the future with me in 3 months or call earlier if necessary.  Greater than 50% time during this 45-minute consultation visit was spent on counseling and coordination of care about stroke discussion about stroke prevention and treatment and answering questions. Antony Contras, MD Note: This document was prepared with  digital dictation and possible smart phrase technology. Any transcriptional errors that result from this process are unintentional.

## 2020-04-09 ENCOUNTER — Ambulatory Visit: Payer: Federal, State, Local not specified - PPO | Admitting: "Endocrinology

## 2020-04-09 ENCOUNTER — Other Ambulatory Visit: Payer: Self-pay | Admitting: Nurse Practitioner

## 2020-04-09 ENCOUNTER — Other Ambulatory Visit: Payer: Self-pay

## 2020-04-09 ENCOUNTER — Encounter: Payer: Self-pay | Admitting: "Endocrinology

## 2020-04-09 VITALS — BP 132/88 | HR 84 | Ht 62.0 in | Wt 212.8 lb

## 2020-04-09 DIAGNOSIS — E1165 Type 2 diabetes mellitus with hyperglycemia: Secondary | ICD-10-CM

## 2020-04-09 DIAGNOSIS — I10 Essential (primary) hypertension: Secondary | ICD-10-CM

## 2020-04-09 DIAGNOSIS — E782 Mixed hyperlipidemia: Secondary | ICD-10-CM

## 2020-04-09 MED ORDER — CVS GLUCOSE METER TEST STRIPS VI STRP
ORAL_STRIP | 2 refills | Status: DC
Start: 2020-04-09 — End: 2021-04-20

## 2020-04-09 NOTE — Patient Instructions (Signed)
                                     Advice for Weight Management  -For most of us the best way to lose weight is by diet management. Generally speaking, diet management means consuming less calories intentionally which over time brings about progressive weight loss.  This can be achieved more effectively by restricting carbohydrate consumption to the minimum possible.  So, it is critically important to know your numbers: how much calorie you are consuming and how much calorie you need. More importantly, our carbohydrates sources should be unprocessed or minimally processed complex starch food items.   Sometimes, it is important to balance nutrition by increasing protein intake (animal or plant source), fruits, and vegetables.  -Sticking to a routine mealtime to eat 3 meals a day and avoiding unnecessary snacks is shown to have a big role in weight control. Under normal circumstances, the only time we lose real weight is when we are hungry, so allow hunger to take place- hunger means no food between meal times, only water.  It is not advisable to starve.   -It is better to avoid simple carbohydrates including: Cakes, Sweet Desserts, Ice Cream, Soda (diet and regular), Sweet Tea, Candies, Chips, Cookies, Store Bought Juices, Alcohol in Excess of  1-2 drinks a day, Lemonade,  Artificial Sweeteners, Doughnuts, Coffee Creamers, "Sugar-free" Products, etc, etc.  This is not a complete list.....    -Consulting with certified diabetes educators is proven to provide you with the most accurate and current information on diet.  Also, you may be  interested in discussing diet options/exchanges , we can schedule a visit with Martha Jensen, RDN, CDE for individualized nutrition education.  -Exercise: If you are able: 30 -60 minutes a day ,4 days a week, or 150 minutes a week.  The longer the better.  Combine stretch, strength, and aerobic activities.  If you were told in the  past that you have high risk for cardiovascular diseases, you may seek evaluation by your heart doctor prior to initiating moderate to intense exercise programs.                                  Additional Care Considerations for Diabetes   -Diabetes  is a chronic disease.  The most important care consideration is regular follow-up with your diabetes care provider with the goal being avoiding or delaying its complications and to take advantage of advances in medications and technology.    -Type 2 diabetes is known to coexist with other important comorbidities such as high blood pressure and high cholesterol.  It is critical to control not only the diabetes but also the high blood pressure and high cholesterol to minimize and delay the risk of complications including coronary artery disease, stroke, amputations, blindness, etc.    - Studies showed that people with diabetes will benefit from a class of medications known as ACE inhibitors and statins.  Unless there are specific reasons not to be on these medications, the standard of care is to consider getting one from these groups of medications at an optimal doses.  These medications are generally considered safe and proven to help protect the heart and the kidneys.    - People with diabetes are encouraged to initiate and maintain regular follow-up with eye doctors, foot   doctors, dentists , and if necessary heart and kidney doctors.     - It is highly recommended that people with diabetes quit smoking or stay away from smoking, and get yearly  flu vaccine and pneumonia vaccine at least every 5 years.  One other important lifestyle recommendation is to ensure adequate sleep - at least 6-7 hours of uninterrupted sleep at night.  -Exercise: If you are able: 30 -60 minutes a day, 4 days a week, or 150 minutes a week.  The longer the better.  Combine stretch, strength, and aerobic activities.  If you were told in the past that you have high risk for  cardiovascular diseases, you may seek evaluation by your heart doctor prior to initiating moderate to intense exercise programs.          

## 2020-04-09 NOTE — Progress Notes (Signed)
Endocrinology Consult Note       04/09/2020, 4:42 PM   Subjective:    Patient ID: Martha Jensen, female    DOB: 1960-09-18.  TIFFANI KADOW is being seen in consultation for management of currently uncontrolled symptomatic diabetes requested by  Noreene Larsson, NP.   Past Medical History:  Diagnosis Date  . Diabetes mellitus without complication ([place])   . Hyperlipidemia   . Hypertension   . Stroke [facility])    Has blurred vision    History reviewed. No pertinent surgical history.  Social History   Socioeconomic History  . Marital status: Divorced    Spouse name: Not on file  . Number of children: Not on file  . Years of education: Not on file  . Highest education level: Not on file  Occupational History  . Occupation: [employer] [employer]    Comment: full time  Tobacco Use  . Smoking status: Never Smoker  . Smokeless tobacco: Never Used  Substance and Sexual Activity  . Alcohol use: Never  . Drug use: Never  . Sexual activity: Not Currently  Other Topics Concern  . Not on file  Social History Narrative   Lives alone   Right Handed   Drinks caffeine 3xweek   Social Determinants of Health   Financial Resource Strain: Not on file  Food Insecurity: Not on file  Transportation Needs: Not on file  Physical Activity: Not on file  Stress: Not on file  Social Connections: Not on file    History reviewed. No pertinent family history.  Outpatient Encounter Medications as of 04/09/2020  Medication Sig  . aspirin EC 81 MG tablet Take 1 tablet (81 mg total) by mouth daily. Swallow whole.  Marland Kitchen atorvastatin (LIPITOR) 80 MG tablet Take 80 mg by mouth daily.  . blood glucose meter kit and supplies Dispense based on patient and insurance preference. Use up to four times daily as directed. (FOR ICD-10 E10.9, E11.9).  . dapagliflozin propanediol (FARXIGA) 10 MG TABS tablet  Take 1 tablet (10 mg total) by mouth daily before breakfast.  . glucose blood ([employer] GLUCOSE METER TEST STRIPS) test strip Test glucose  4 times per day- One Touch  . Lancets (ONETOUCH ULTRASOFT) lancets Use as instructed, can use up to 4 times per day.  . lisinopril (ZESTRIL) 20 MG tablet Take 1 tablet (20 mg total) by mouth daily.  . metFORMIN (GLUCOPHAGE) 500 MG tablet Take 1 tablet PO daily for 1 week, then take 1 tablet PO BID  . [DISCONTINUED] glucose blood ([employer]S GLUCOSE METER TEST STRIPS) test strip Use as instructed, up to 4 times per day   No facility-administered encounter medications on file as of 04/09/2020.    ALLERGIES: No Known Allergies  VACCINATION STATUS: Immunization History  Administered Date(s) Administered  . Pneumococcal Polysaccharide-23 04/04/2020    Diabetes She presents for her initial diabetic visit. She has type 2 diabetes mellitus. Onset time: Patient was diagnosed at approximate age of 24 years. Her disease course has been worsening. There are no hypoglycemic associated symptoms. Pertinent negatives for hypoglycemia include no confusion, headaches, pallor or seizures.  Associated symptoms include blurred vision, fatigue, polydipsia and polyuria. Pertinent negatives for diabetes include no chest pain and no polyphagia. Symptoms are worsening. Diabetic complications include a CVA. Risk factors for coronary artery disease include diabetes mellitus, dyslipidemia, hypertension, obesity, family history, sedentary lifestyle and post-menopausal. Current diabetic treatments: She is currently on Metformin 500 mg p.o. twice daily, Farxiga 10 mg p.o. daily. Her weight is fluctuating minimally. She is following a generally unhealthy diet. When asked about meal planning, she reported none. She has not had a previous visit with a dietitian. She rarely participates in exercise. Her home blood glucose trend is decreasing steadily. Her overall blood glucose range is 180-200 mg/dl. (She  reports blood glucose readings between 100-150 at fasting.  She did not bring any meter nor logs with her.  Her recent A1c was 14.3%.  At diagnosis in February 2022 her A1c was >16%.) An ACE inhibitor/angiotensin II receptor blocker is being taken. Eye exam is current.     Review of Systems  Constitutional: Positive for fatigue. Negative for chills, fever and unexpected weight change.  HENT: Negative for trouble swallowing and voice change.   Eyes: Positive for blurred vision. Negative for visual disturbance.  Respiratory: Negative for cough, shortness of breath and wheezing.   Cardiovascular: Negative for chest pain, palpitations and leg swelling.  Gastrointestinal: Negative for diarrhea, nausea and vomiting.  Endocrine: Positive for polydipsia and polyuria. Negative for cold intolerance, heat intolerance and polyphagia.  Musculoskeletal: Negative for arthralgias and myalgias.  Skin: Negative for color change, pallor, rash and wound.  Neurological: Negative for seizures and headaches.  Psychiatric/Behavioral: Negative for confusion and suicidal ideas.    Objective:    Vitals with BMI 04/09/2020 04/08/2020 04/04/2020  Height 5' 2"  5' 2"  5' 2"   Weight 212 lbs 13 oz 212 lbs 3 oz 210 lbs  BMI 38.91 68.1 15.7  Systolic 262 035 597  Diastolic 88 95 416  Pulse 84 85 100    BP 132/88   Pulse 84   Ht 5' 2"  (1.575 m)   Wt 212 lb 12.8 oz (96.5 kg)   BMI 38.92 kg/m   Wt Readings from Last 3 Encounters:  04/09/20 212 lb 12.8 oz (96.5 kg)  04/08/20 212 lb 3.2 oz (96.3 kg)  04/04/20 210 lb (95.3 kg)     Physical Exam Constitutional:      Appearance: She is well-developed.  HENT:     Head: Normocephalic and atraumatic.  Neck:     Thyroid: No thyromegaly.     Trachea: No tracheal deviation.  Cardiovascular:     Rate and Rhythm: Normal rate and regular rhythm.  Pulmonary:     Effort: Pulmonary effort is normal.  Abdominal:     Tenderness: There is no abdominal tenderness. There is  no guarding.  Musculoskeletal:        General: Normal range of motion.     Cervical back: Normal range of motion and neck supple.  Skin:    General: Skin is warm and dry.     Coloration: Skin is not pale.     Findings: No erythema or rash.  Neurological:     General: No focal deficit present.     Mental Status: She is alert and oriented to person, place, and time.     Cranial Nerves: No cranial nerve deficit.     Coordination: Coordination normal.     Deep Tendon Reflexes: Reflexes are normal and symmetric.     Comments: Monofilament test is normal  on bilateral lower extremities.  Psychiatric:        Judgment: Judgment normal.       CMP ( most recent) CMP     Component Value Date/Time   NA 137 03/21/2020 0956   K 3.6 03/21/2020 0956   CL 100 03/21/2020 0956   CO2 22 03/21/2020 0956   GLUCOSE 295 (H) 03/21/2020 0956   BUN 6 (L) 03/21/2020 0956   CREATININE 0.82 03/21/2020 0956   CALCIUM 9.3 03/21/2020 0956   PROT 6.8 03/21/2020 0956   ALBUMIN 4.1 03/21/2020 0956   AST 28 03/21/2020 0956   ALT 34 (H) 03/21/2020 0956   ALKPHOS 75 03/21/2020 0956   BILITOT 0.3 03/21/2020 0956     Diabetic Labs (most recent): Lab Results  Component Value Date   HGBA1C 14.3 (H) 04/08/2020    Lipid Panel     Component Value Date/Time   CHOL 128 04/08/2020 1407   TRIG 73 04/08/2020 1407   HDL 52 04/08/2020 1407   CHOLHDL 2.5 04/08/2020 1407   LDLCALC 61 04/08/2020 1407   LABVLDL 15 04/08/2020 1407      Lab Results  Component Value Date   TSH 1.010 04/08/2020      Assessment & Plan:   1. Uncontrolled type 2 diabetes mellitus with hyperglycemia ([place])  - AUDRIELLA BLAKELEY has currently uncontrolled symptomatic type 2 DM since  60 years of age,  with most recent A1c of 14.3 %. Recent labs reviewed. - I had a long discussion with her about the progressive nature of diabetes and the pathology behind its complications. -her diabetes is complicated by CVA, obesity/sedentary life  and she remains at a high risk for more acute and chronic complications which include CAD, CVA, CKD, retinopathy, and neuropathy. These are all discussed in detail with her.  - I have counseled her on diet  and weight management  by adopting a carbohydrate restricted/protein rich diet. Patient is encouraged to switch to  unprocessed or minimally processed     complex starch and increased protein intake (animal or plant source), fruits, and vegetables. -  she is advised to stick to a routine mealtimes to eat 3 meals  a day and avoid unnecessary snacks ( to snack only to correct hypoglycemia).   - she acknowledges that there is a room for improvement in her food and drink choices. - Suggestion is made for her to avoid simple carbohydrates  from her diet including Cakes, Sweet Desserts, Ice Cream, Soda (diet and regular), Sweet Tea, Candies, Chips, Cookies, Store Bought Juices, Alcohol in Excess of  1-2 drinks a day, Artificial Sweeteners,  Coffee Creamer, and "Sugar-free" Products. This will help patient to have more stable blood glucose profile and potentially avoid unintended weight gain.  - she will be scheduled with Jearld Fenton, RDN, CDE for diabetes education.  - I have approached her with the following individualized plan to manage  her diabetes and patient agrees:   - she will likely need insulin treatment in order for her to achieve control of diabetes to target.    -He is hesitant, however willing to initiate strict monitoring of glucose 4 times a day-before meals and at bedtime.  - she is encouraged to call clinic for blood glucose levels less than 70 or above 300 mg /dl. -In the meantime, she is advised to increase her Metformin to 500 mg p.o. twice daily, cautiously continue Farxiga 10 mg p.o. daily at breakfast.  She is informed of side effects from  these medications. - she will be considered for incretin therapy as appropriate next visit.  - Specific targets for  A1c;  LDL, HDL,   and Triglycerides were discussed with the patient.  2) Blood Pressure /Hypertension:  her blood pressure is  controlled to target.   she is advised to continue her current medications including lisinopril 20 mg p.o. daily with breakfast . 3) Lipids/Hyperlipidemia:   Review of her recent lipid panel showed  controlled  LDL at 61 .  she  is advised to continue    Lipitor 80 mg daily at bedtime.  Side effects and precautions discussed with her.  4)  Weight/Diet:  Body mass index is 38.92 kg/m.  -   clearly complicating her diabetes care.   she is  a candidate for weight loss. I discussed with her the fact that loss of 5 - 10% of her  current body weight will have the most impact on her diabetes management.  Exercise, and detailed carbohydrates information provided  -  detailed on discharge instructions.  5) Chronic Care/Health Maintenance:  -she  is on ACEI/ARB and Statin medications and  is encouraged to initiate and continue to follow up with Ophthalmology, Dentist,  Podiatrist at least yearly or according to recommendations, and advised to   stay away from smoking. I have recommended yearly flu vaccine and pneumonia vaccine at least every 5 years; moderate intensity exercise for up to 150 minutes weekly; and  sleep for at least 7 hours a day.  - she is  advised to maintain close follow up with Noreene Larsson, NP for primary care needs, as well as her other providers for optimal and coordinated care.   - Time spent in this patient care: 60 min, of which > 50% was spent in  counseling  her about her chronically uncontrolled type 2 diabetes which is complicated by CVA, hyperlipidemia, hypertension and the rest reviewing her blood glucose logs , discussing her hypoglycemia and hyperglycemia episodes, reviewing her current and  previous labs / studies  ( including abstraction from other facilities) and medications  doses and developing a  long term treatment plan based on the latest standards of care/  guidelines; and documenting her care.    Please refer to Patient Instructions for Blood Glucose Monitoring and Insulin/Medications Dosing Guide"  in media tab for additional information. Please  also refer to " Patient Self Inventory" in the Media  tab for reviewed elements of pertinent patient history.  Merian Capron participated in the discussions, expressed understanding, and voiced agreement with the above plans.  All questions were answered to her satisfaction. she is encouraged to contact clinic should she have any questions or concerns prior to her return visit.   Follow up plan: - Return in about 10 days (around 04/19/2020) for F/U with Meter and Logs Only - no Labs.  Glade Lloyd, MD [facility] [address] [place], [place] [address] Phone: [phone]  Fax: [phone]    04/09/2020, 4:42 PM  This note was partially dictated with voice recognition software. Similar sounding words can be transcribed inadequately or may not  be corrected upon review.

## 2020-04-11 ENCOUNTER — Telehealth: Payer: Self-pay

## 2020-04-11 NOTE — Progress Notes (Signed)
Kindly advise the patient that cholesterol profile was satisfactory but screening test for diabetes was quite abnormal and she needs to see her primary care physician for better diabetes control.  Thyroid hormone test was normal.  Vitamin B12 and vitamin D results are not back yet

## 2020-04-11 NOTE — Telephone Encounter (Signed)
Patient called said the referral made to ophthalmology Christ[facility] Groat they can not see her til May 5 and needs a sooner appt. If possible to be referred out elsewhere.  Patient contact # [phone].

## 2020-04-14 ENCOUNTER — Telehealth: Payer: Self-pay | Admitting: *Deleted

## 2020-04-14 ENCOUNTER — Other Ambulatory Visit: Payer: Self-pay

## 2020-04-14 ENCOUNTER — Telehealth: Payer: Self-pay

## 2020-04-14 DIAGNOSIS — I639 Cerebral infarction, unspecified: Secondary | ICD-10-CM

## 2020-04-14 NOTE — Telephone Encounter (Signed)
Anywhere would be fine as long as they are ophthalmology.

## 2020-04-14 NOTE — Telephone Encounter (Signed)
Attending phy initial form Copied Sleeved Noted

## 2020-04-14 NOTE — Telephone Encounter (Signed)
-----   Message from Wyvonnia Lora, RN sent at 04/14/2020  5:15 PM EDT -----  ----- Message ----- From: Garvin Fila, MD Sent: 04/11/2020   3:04 PM EDT To: Noreene Larsson, NP, Baldomero Lamy, RN  Kindly advise the patient that cholesterol profile was satisfactory but screening test for diabetes was quite abnormal and she needs to see her primary care physician for better diabetes control.  Thyroid hormone test was normal.  Vitamin B12 and vitamin D results are not back yet

## 2020-04-14 NOTE — Telephone Encounter (Signed)
Referral sent to [facility].

## 2020-04-14 NOTE — Telephone Encounter (Signed)
Where else would you like to refer to?

## 2020-04-14 NOTE — Telephone Encounter (Signed)
Called pt. Relayed results per Dr. Clydene Fake message. She verbalized understanding. Pt will f/u with endocrinologist, Dr. Dorris Fetch about diabetes. I sent copy of results via epic.

## 2020-04-15 ENCOUNTER — Other Ambulatory Visit: Payer: Self-pay | Admitting: Nurse Practitioner

## 2020-04-16 ENCOUNTER — Ambulatory Visit: Payer: Self-pay | Admitting: Physician Assistant

## 2020-04-16 LAB — HEMOGLOBIN A1C
Est. average glucose Bld gHb Est-mCnc: 364 mg/dL
Hgb A1c MFr Bld: 14.3 % — ABNORMAL HIGH (ref 4.8–5.6)

## 2020-04-16 LAB — LIPID PANEL
Chol/HDL Ratio: 2.5 ratio (ref 0.0–4.4)
Cholesterol, Total: 128 mg/dL (ref 100–199)
HDL: 52 mg/dL (ref >39)
LDL Chol Calc (NIH): 61 mg/dL (ref 0–99)
Triglycerides: 73 mg/dL (ref 0–149)
VLDL Cholesterol Cal: 15 mg/dL (ref 5–40)

## 2020-04-16 LAB — VITAMIN D 1,25 DIHYDROXY
Vitamin D 1, 25 (OH)2 Total: 46 pg/mL
Vitamin D2 1, 25 (OH)2: <10 pg/mL
Vitamin D3 1, 25 (OH)2: 44 pg/mL

## 2020-04-16 LAB — VITAMIN B2, WHOLE BLOOD: Vitamin B2, Whole Blood: 208 ug/L (ref 137–370)

## 2020-04-16 LAB — TSH: TSH: 1.01 u[IU]/mL (ref 0.450–4.500)

## 2020-04-18 ENCOUNTER — Encounter: Payer: Self-pay | Admitting: Nurse Practitioner

## 2020-04-18 ENCOUNTER — Ambulatory Visit (INDEPENDENT_AMBULATORY_CARE_PROVIDER_SITE_OTHER): Payer: Federal, State, Local not specified - PPO | Admitting: Nurse Practitioner

## 2020-04-18 ENCOUNTER — Other Ambulatory Visit: Payer: Self-pay

## 2020-04-18 DIAGNOSIS — I10 Essential (primary) hypertension: Secondary | ICD-10-CM

## 2020-04-18 DIAGNOSIS — I639 Cerebral infarction, unspecified: Secondary | ICD-10-CM | POA: Diagnosis not present

## 2020-04-18 MED ORDER — AMLODIPINE BESYLATE 5 MG PO TABS: 5.0000 mg | ORAL_TABLET | Freq: Every day | ORAL | 1 refills | Status: DC

## 2020-04-18 NOTE — Assessment & Plan Note (Signed)
-  followed by neurology- she states he revoked driving privileges d/t poor vision; recommended he complete the disability paperwork -has blurred vision still; could be related to diabetic eye changes -needs appt with ophthalmology -pt refused PT/OT

## 2020-04-18 NOTE — Assessment & Plan Note (Signed)
-  Rx. Amlodipine -home BPs 140/100s  BP Readings from Last 3 Encounters:  04/18/20 (!) 188/109  04/09/20 132/88  04/08/20 (!) 150/95

## 2020-04-18 NOTE — Progress Notes (Signed)
Acute Office Visit  Subjective:    Patient ID: Martha Jensen, female    DOB: [birth year], 60 y.o.   MRN: [id number]  Chief Complaint  Patient presents with  . Follow-up    Occipital infarction   . Hypertension    HPI Patient is in today for BP check. She states that home BP has been around 140/100.  She has disability forms that she would like to be completed.  I have already completed documentation to the best of my ability, but she would like further documentation that would give her a class 5 physical impairment.  On 03/12/20, she was admitted to [facility]  for subacute right occipital cortical infarct. She was also found to have hyperglycemia. She had SLP evaluation, and no further SLP therapy was required. PT recommended 3x/wk therapy. At her last OV, she was referred to ophthalmology, neuro, PT/OT. Pt refused PT and OT. Neurology called her 2x but she has not been scheduled. She states that she can't get to her appointments because she is unable to drive.  Her A1c was > 16% when drawn at [facility]. At her OV on 03/21/20, she was started on metformin and farxiga. Endocrinology says ready to be scheduled. She is having difficulty getting transportation to her appointments. She states that her blood sugars have run from 100-146, so that is much improved.   Since her stroke, she has had blurred vision, and she is unable to read d/t poor vision.  She is also states that neurology instructed her not to drive, and she has eye exam in early May    Past Medical History:  Diagnosis Date  . Diabetes mellitus without complication ([place])   . Hyperlipidemia   . Hypertension   . Stroke [facility])    Has blurred vision    History reviewed. No pertinent surgical history.  History reviewed. No pertinent family history.  Social History   Socioeconomic History  . Marital status: Divorced    Spouse name: Not on file  . Number of children: Not on file  . Years of education:  Not on file  . Highest education level: Not on file  Occupational History  . Occupation: [employer] to [employer]    Comment: full time  Tobacco Use  . Smoking status: Never Smoker  . Smokeless tobacco: Never Used  Substance and Sexual Activity  . Alcohol use: Never  . Drug use: Never  . Sexual activity: Not Currently  Other Topics Concern  . Not on file  Social History Narrative   Lives alone   Right Handed   Drinks caffeine 3xweek   Social Determinants of Health   Financial Resource Strain: Not on file  Food Insecurity: Not on file  Transportation Needs: Not on file  Physical Activity: Not on file  Stress: Not on file  Social Connections: Not on file  Intimate Partner Violence: Not on file    Outpatient Medications Prior to Visit  Medication Sig Dispense Refill  . aspirin EC 81 MG tablet Take 1 tablet (81 mg total) by mouth daily. Swallow whole. 30 tablet 11  . atorvastatin (LIPITOR) 80 MG tablet Take 80 mg by mouth daily.    . blood glucose meter kit and supplies Dispense based on patient and insurance preference. Use up to four times daily as directed. (FOR ICD-10 E10.9, E11.9). 1 each 0  . dapagliflozin propanediol (FARXIGA) 10 MG TABS tablet Take 1 tablet (10 mg total) by mouth daily before breakfast. 90  tablet 1  . glucose blood ([employer]S GLUCOSE METER TEST STRIPS) test strip Test glucose  4 times per day- One Touch 100 each 2  . Lancets (ONETOUCH ULTRASOFT) lancets Use as instructed, can use up to 4 times per day. 100 each 12  . lisinopril (ZESTRIL) 20 MG tablet Take 1 tablet (20 mg total) by mouth daily. 30 tablet 1  . metFORMIN (GLUCOPHAGE) 500 MG tablet Take 1 tablet PO daily for 1 week, then take 1 tablet PO BID 180 tablet 3   No facility-administered medications prior to visit.    No Known Allergies  Review of Systems  Constitutional: Negative.   Eyes: Positive for visual disturbance.       Since her stroke  Respiratory: Negative.    Cardiovascular: Negative.   Psychiatric/Behavioral: Negative.        Objective:    Physical Exam Constitutional:      Appearance: She is obese.  Cardiovascular:     Rate and Rhythm: Normal rate and regular rhythm.     Pulses: Normal pulses.     Heart sounds: Normal heart sounds.  Pulmonary:     Effort: Pulmonary effort is normal.     Breath sounds: Normal breath sounds.  Neurological:     Mental Status: She is alert.  Psychiatric:        Mood and Affect: Mood normal.        Behavior: Behavior normal.        Thought Content: Thought content normal.        Judgment: Judgment normal.     BP (!) 188/109   Pulse 96   Temp 98 F (36.7 C)   Resp 20   Ht _0  (1.575 m)   Wt 208 lb (94.3 kg)   SpO2 98%   BMI 38.04 kg/m  Wt Readings from Last 3 Encounters:  04/18/20 208 lb (94.3 kg)  04/09/20 212 lb 12.8 oz (96.5 kg)  04/08/20 212 lb 3.2 oz (96.3 kg)    Health Maintenance Due  Topic Date Due  . COVID-19 Vaccine (1) Never done  . FOOT EXAM  Never done  . OPHTHALMOLOGY EXAM  Never done  . HIV Screening  Never done  . TETANUS/TDAP  Never done  . PAP SMEAR-Modifier  Never done  . COLONOSCOPY (Pts 45-50yr Insurance coverage will need to be confirmed)  Never done  . MAMMOGRAM  Never done    There are no preventive care reminders to display for this patient.   Lab Results  Component Value Date   TSH 1.010 04/08/2020   Lab Results  Component Value Date   WBC 6.8 03/21/2020   HGB 13.8 03/21/2020   HCT 43.3 03/21/2020   MCV 81 03/21/2020   PLT 234 03/21/2020   Lab Results  Component Value Date   NA 137 03/21/2020   K 3.6 03/21/2020   CO2 22 03/21/2020   GLUCOSE 295 (H) 03/21/2020   BUN 6 (L) 03/21/2020   CREATININE 0.82 03/21/2020   BILITOT 0.3 03/21/2020   ALKPHOS 75 03/21/2020   AST 28 03/21/2020   ALT 34 (H) 03/21/2020   PROT 6.8 03/21/2020   ALBUMIN 4.1 03/21/2020   CALCIUM 9.3 03/21/2020   Lab Results  Component Value Date   CHOL 128  04/08/2020   Lab Results  Component Value Date   HDL 52 04/08/2020   Lab Results  Component Value Date   LDLCALC 61 04/08/2020   Lab Results  Component Value Date   TRIG  73 04/08/2020   Lab Results  Component Value Date   CHOLHDL 2.5 04/08/2020   Lab Results  Component Value Date   HGBA1C 14.3 (H) 04/08/2020       Assessment & Plan:   Problem List Items Addressed This Visit      Cardiovascular and Mediastinum   Essential hypertension, benign    -Rx. Amlodipine -home BPs 140/100s  BP Readings from Last 3 Encounters:  04/18/20 (!) 188/109  04/09/20 132/88  04/08/20 (!) 150/95         Relevant Medications   amLODipine (NORVASC) 5 MG tablet     Nervous and Auditory   Occipital infarction [facility])    -followed by neurology- she states he revoked driving privileges d/t poor vision; recommended he complete the disability paperwork -has blurred vision still; could be related to diabetic eye changes -needs appt with ophthalmology -pt refused PT/OT           Meds ordered this encounter  Medications  . amLODipine (NORVASC) 5 MG tablet    Sig: Take 1 tablet (5 mg total) by mouth daily.    Dispense:  30 tablet    Refill:  [place], NP

## 2020-04-24 ENCOUNTER — Encounter: Payer: Self-pay | Admitting: "Endocrinology

## 2020-04-24 ENCOUNTER — Ambulatory Visit: Payer: Federal, State, Local not specified - PPO | Admitting: "Endocrinology

## 2020-04-24 VITALS — BP 163/112 | HR 89 | Ht 62.0 in | Wt 211.0 lb

## 2020-04-24 DIAGNOSIS — E1165 Type 2 diabetes mellitus with hyperglycemia: Secondary | ICD-10-CM

## 2020-04-24 DIAGNOSIS — E782 Mixed hyperlipidemia: Secondary | ICD-10-CM | POA: Diagnosis not present

## 2020-04-24 DIAGNOSIS — I10 Essential (primary) hypertension: Secondary | ICD-10-CM | POA: Diagnosis not present

## 2020-04-24 MED ORDER — ATORVASTATIN CALCIUM 80 MG PO TABS: 80.0000 mg | ORAL_TABLET | Freq: Every day | ORAL | 1 refills | Status: DC

## 2020-04-24 NOTE — Progress Notes (Signed)
04/24/2020, 4:00 PM  Endocrinology follow-up note   Subjective:    Patient ID: Martha Jensen, female    DOB: 29-May-1960.  Martha Jensen is being seen in follow-up after he was seen in consultation for management of currently uncontrolled symptomatic diabetes requested by  Noreene Larsson, NP.   Past Medical History:  Diagnosis Date  . Diabetes mellitus without complication ([place])   . Hyperlipidemia   . Hypertension   . Stroke [facility])    Has blurred vision    History reviewed. No pertinent surgical history.  Social History   Socioeconomic History  . Marital status: Divorced    Spouse name: Not on file  . Number of children: Not on file  . Years of education: Not on file  . Highest education level: Not on file  Occupational History  . Occupation: [employer]    Comment: full time  Tobacco Use  . Smoking status: Never Smoker  . Smokeless tobacco: Never Used  Substance and Sexual Activity  . Alcohol use: Never  . Drug use: Never  . Sexual activity: Not Currently  Other Topics Concern  . Not on file  Social History Narrative   Lives alone   Right Handed   Drinks caffeine 3xweek   Social Determinants of Health   Financial Resource Strain: Not on file  Food Insecurity: Not on file  Transportation Needs: Not on file  Physical Activity: Not on file  Stress: Not on file  Social Connections: Not on file    History reviewed. No pertinent family history.  Outpatient Encounter Medications as of 04/24/2020  Medication Sig  . amLODipine (NORVASC) 5 MG tablet Take 1 tablet (5 mg total) by mouth daily.  Marland Kitchen aspirin EC 81 MG tablet Take 1 tablet (81 mg total) by mouth daily. Swallow whole.  Marland Kitchen atorvastatin (LIPITOR) 80 MG tablet Take 1 tablet (80 mg total) by mouth at bedtime.  . blood glucose meter kit and supplies Dispense based on patient and insurance preference.  Use up to four times daily as directed. (FOR ICD-10 E10.9, E11.9).  . dapagliflozin propanediol (FARXIGA) 10 MG TABS tablet Take 1 tablet (10 mg total) by mouth daily before breakfast.  . glucose blood ([employer] GLUCOSE METER TEST STRIPS) test strip Test glucose  4 times per day- One Touch  . Lancets (ONETOUCH ULTRASOFT) lancets Use as instructed, can use up to 4 times per day.  . lisinopril (ZESTRIL) 20 MG tablet Take 1 tablet (20 mg total) by mouth daily.  . metFORMIN (GLUCOPHAGE) 500 MG tablet Take 1 tablet PO daily for 1 week, then take 1 tablet PO BID  . [DISCONTINUED] atorvastatin (LIPITOR) 80 MG tablet Take 80 mg by mouth daily.   No facility-administered encounter medications on file as of 04/24/2020.    ALLERGIES: No Known Allergies  VACCINATION STATUS: Immunization History  Administered Date(s) Administered  . Pneumococcal Polysaccharide-23 04/04/2020    Diabetes She presents for her initial diabetic visit. She has type 2 diabetes mellitus. Onset time: Patient was diagnosed at approximate age of 29 years. Her disease course has been improving. There are no hypoglycemic associated symptoms. Pertinent negatives for  hypoglycemia include no confusion, headaches, pallor or seizures. Associated symptoms include blurred vision and fatigue. Pertinent negatives for diabetes include no chest pain, no polydipsia, no polyphagia and no polyuria. Symptoms are improving. Diabetic complications include a CVA. Risk factors for coronary artery disease include diabetes mellitus, dyslipidemia, hypertension, obesity, family history, sedentary lifestyle and post-menopausal. Current diabetic treatments: She is currently on Metformin 500 mg p.o. twice daily, Farxiga 10 mg p.o. daily. Her weight is fluctuating minimally. She is following a generally unhealthy diet. When asked about meal planning, she reported none. She has not had a previous visit with a dietitian. She rarely participates in exercise. Her home blood  glucose trend is decreasing steadily. Her overall blood glucose range is 130-140 mg/dl. (She returns with dramatic improvement in her glycemic profile fasting between 87-125, prelunch between 93-167, presupper between 109-134, bedtime between 77-161.   She did not bring her meter with her.    Her recent A1c was 14.3%.  At diagnosis in February 2022 her A1c was >16%.) An ACE inhibitor/angiotensin II receptor blocker is being taken. Eye exam is current.     Review of Systems  Constitutional: Positive for fatigue. Negative for chills, fever and unexpected weight change.  HENT: Negative for trouble swallowing and voice change.   Eyes: Positive for blurred vision. Negative for visual disturbance.  Respiratory: Negative for cough, shortness of breath and wheezing.   Cardiovascular: Negative for chest pain, palpitations and leg swelling.  Gastrointestinal: Negative for diarrhea, nausea and vomiting.  Endocrine: Negative for cold intolerance, heat intolerance, polydipsia, polyphagia and polyuria.  Musculoskeletal: Negative for arthralgias and myalgias.  Skin: Negative for color change, pallor, rash and wound.  Neurological: Negative for seizures and headaches.  Psychiatric/Behavioral: Negative for confusion and suicidal ideas.    Objective:    Vitals with BMI 04/24/2020 04/18/2020 04/09/2020  Height 5' 2"  5' 2"  5' 2"   Weight 211 lbs 208 lbs 212 lbs 13 oz  BMI 38.58 95.32 02.33  Systolic 435 686 168  Diastolic 372 902 88  Pulse 89 96 84    BP (!) 163/112 (BP Location: Right Arm, Patient Position: Supine) Comment: 148/92 R arm sitting  Pulse 89   Ht 5' 2"  (1.575 m)   Wt 211 lb (95.7 kg)   BMI 38.59 kg/m   Wt Readings from Last 3 Encounters:  04/24/20 211 lb (95.7 kg)  04/18/20 208 lb (94.3 kg)  04/09/20 212 lb 12.8 oz (96.5 kg)     Physical Exam Constitutional:      Appearance: She is well-developed.  HENT:     Head: Normocephalic and atraumatic.  Neck:     Thyroid: No thyromegaly.      Trachea: No tracheal deviation.  Cardiovascular:     Rate and Rhythm: Normal rate and regular rhythm.  Pulmonary:     Effort: Pulmonary effort is normal.  Abdominal:     Tenderness: There is no abdominal tenderness. There is no guarding.  Musculoskeletal:        General: Normal range of motion.     Cervical back: Normal range of motion and neck supple.  Skin:    General: Skin is warm and dry.     Coloration: Skin is not pale.     Findings: No erythema or rash.  Neurological:     General: No focal deficit present.     Mental Status: She is alert and oriented to person, place, and time.     Cranial Nerves: No cranial nerve deficit.  Coordination: Coordination normal.     Deep Tendon Reflexes: Reflexes are normal and symmetric.     Comments: Monofilament test is normal on bilateral lower extremities.  Psychiatric:        Judgment: Judgment normal.       CMP ( most recent) CMP     Component Value Date/Time   NA 137 03/21/2020 0956   K 3.6 03/21/2020 0956   CL 100 03/21/2020 0956   CO2 22 03/21/2020 0956   GLUCOSE 295 (H) 03/21/2020 0956   BUN 6 (L) 03/21/2020 0956   CREATININE 0.82 03/21/2020 0956   CALCIUM 9.3 03/21/2020 0956   PROT 6.8 03/21/2020 0956   ALBUMIN 4.1 03/21/2020 0956   AST 28 03/21/2020 0956   ALT 34 (H) 03/21/2020 0956   ALKPHOS 75 03/21/2020 0956   BILITOT 0.3 03/21/2020 0956     Diabetic Labs (most recent): Lab Results  Component Value Date   HGBA1C 14.3 (H) 04/08/2020    Lipid Panel     Component Value Date/Time   CHOL 128 04/08/2020 1407   TRIG 73 04/08/2020 1407   HDL 52 04/08/2020 1407   CHOLHDL 2.5 04/08/2020 1407   LDLCALC 61 04/08/2020 1407   LABVLDL 15 04/08/2020 1407      Lab Results  Component Value Date   TSH 1.010 04/08/2020      Assessment & Plan:   1. Uncontrolled type 2 diabetes mellitus with hyperglycemia (HCC)  - KETRA DUCHESNE has currently uncontrolled symptomatic type 2 DM since  60 years of  age.   She returns with dramatic improvement in her glycemic profile fasting between 87-125, prelunch between 93-167, presupper between 109-134, bedtime between 77-161.   She did not bring her meter with her.    Her recent A1c was 14.3%.  At diagnosis in February 2022 her A1c was >16%.   Recent labs reviewed. - I had a long discussion with her about the progressive nature of diabetes and the pathology behind its complications. -her diabetes is complicated by CVA, obesity/sedentary life and she remains at a high risk for more acute and chronic complications which include CAD, CVA, CKD, retinopathy, and neuropathy. These are all discussed in detail with her.  - I have counseled her on diet  and weight management  by adopting a carbohydrate restricted/protein rich diet. Patient is encouraged to switch to  unprocessed or minimally processed     complex starch and increased protein intake (animal or plant source), fruits, and vegetables. -  she is advised to stick to a routine mealtimes to eat 3 meals  a day and avoid unnecessary snacks ( to snack only to correct hypoglycemia).   - she acknowledges that there is a room for improvement in her food and drink choices. - Suggestion is made for her to avoid simple carbohydrates  from her diet including Cakes, Sweet Desserts, Ice Cream, Soda (diet and regular), Sweet Tea, Candies, Chips, Cookies, Store Bought Juices, Alcohol in Excess of  1-2 drinks a day, Artificial Sweeteners,  Coffee Creamer, and "Sugar-free" Products, Lemonade. This will help patient to have more stable blood glucose profile and potentially avoid unintended weight gain.  - she will be scheduled with Jearld Fenton, RDN, CDE for diabetes education.  - I have approached her with the following individualized plan to manage  her diabetes and patient agrees:   -Based on her presentation with near target glycemic profile she will not need insulin treatment for now.    -He is hesitant,  however willing to continue to monitor blood glucose-advised to monitor twice a day before breakfast and at bedtime   - she is encouraged to call clinic for blood glucose levels less than 70 or above 200 mg /dl. -In the meantime, she is advised to continue Metformin 500 mg p.o. twice daily, continue cautious Farxiga 10 mg p.o. daily at breakfast.  She is informed of side effects from these medications. - she will be considered for incretin therapy as appropriate next visit.  - Specific targets for  A1c;  LDL, HDL,  and Triglycerides were discussed with the patient.  2) Blood Pressure /Hypertension:  Her blood pressure is not controlled to target.  Repeat was 148/92. she is advised to continue her current medications including lisinopril 20 mg p.o. daily with breakfast . 3) Lipids/Hyperlipidemia:   Review of her recent lipid panel showed  controlled  LDL at 61 .  she  is advised to continue Lipitor 80 mg p.o. daily at bedtime.  Side effects and precautions discussed with her.  4)  Weight/Diet:  Body mass index is 38.59 kg/m.  -   clearly complicating her diabetes care.   she is  a candidate for weight loss. I discussed with her the fact that loss of 5 - 10% of her  current body weight will have the most impact on her diabetes management.  Exercise, and detailed carbohydrates information provided  -  detailed on discharge instructions.  5) Chronic Care/Health Maintenance:  -she  is on ACEI/ARB and Statin medications and  is encouraged to initiate and continue to follow up with Ophthalmology, Dentist,  Podiatrist at least yearly or according to recommendations, and advised to   stay away from smoking. I have recommended yearly flu vaccine and pneumonia vaccine at least every 5 years; moderate intensity exercise for up to 150 minutes weekly; and  sleep for at least 7 hours a day.  POCT ABI Results 04/24/20  Her screening ABI was normal today April 24, 2020.  Her neck study is in April 2027, or sooner  if needed. Right ABI: 1.18      left ABI: 1.21  Right leg systolic / diastolic: 767/209 mmHg Left leg systolic / diastolic: 470/962 mmHg  Arm systolic / diastolic: 836/629 mmHG   - she is  advised to maintain close follow up with Noreene Larsson, NP for primary care needs, as well as her other providers for optimal and coordinated care.   I spent 45 minutes in the care of the patient today including review of labs from [place], Lipids, Thyroid Function, Hematology (current and previous including abstractions from other facilities); face-to-face time discussing  her blood glucose readings/logs, discussing hypoglycemia and hyperglycemia episodes and symptoms, medications doses, her options of short and long term treatment based on the latest standards of care / guidelines;  discussion about incorporating lifestyle medicine;  and documenting the encounter.    Please refer to Patient Instructions for Blood Glucose Monitoring and Insulin/Medications Dosing Guide"  in media tab for additional information. Please  also refer to " Patient Self Inventory" in the Media  tab for reviewed elements of pertinent patient history.  Merian Capron participated in the discussions, expressed understanding, and voiced agreement with the above plans.  All questions were answered to her satisfaction. she is encouraged to contact clinic should she have any questions or concerns prior to her return visit.    Follow up plan: - Return in about 4 months (around 08/24/2020) for Bring Meter  and Logs- A1c in Office, [place] with Abdullah Rizzi.  Glade Lloyd, MD [facility] [address] [place], [place] [address] Phone: [phone]  Fax: [phone]    04/24/2020, 4:00 PM  This note was partially dictated with voice recognition software. Similar sounding words can be transcribed inadequately or may not  be corrected upon review.

## 2020-04-24 NOTE — Patient Instructions (Signed)

## 2020-04-25 ENCOUNTER — Ambulatory Visit: Payer: Federal, State, Local not specified - PPO | Admitting: Nurse Practitioner

## 2020-04-25 ENCOUNTER — Encounter: Payer: Self-pay | Admitting: Nurse Practitioner

## 2020-04-25 ENCOUNTER — Other Ambulatory Visit: Payer: Self-pay

## 2020-04-25 VITALS — BP 172/110 | HR 90 | Temp 98.8°F | Resp 18 | Ht 62.0 in | Wt 213.0 lb

## 2020-04-25 DIAGNOSIS — Z139 Encounter for screening, unspecified: Secondary | ICD-10-CM | POA: Diagnosis not present

## 2020-04-25 DIAGNOSIS — I10 Essential (primary) hypertension: Secondary | ICD-10-CM

## 2020-04-25 MED ORDER — AMLODIPINE BESYLATE 10 MG PO TABS: 10.0000 mg | ORAL_TABLET | Freq: Every day | ORAL | 3 refills | Status: AC

## 2020-04-25 NOTE — Progress Notes (Signed)
Acute Office Visit  Subjective:    Patient ID: Martha Jensen, female    DOB: 28-Jul-1960, 60 y.o.   MRN: [id number]  Chief Complaint  Patient presents with  . Hypertension    Follow up, pt needs letter for HR stating she is out of work currently due to recent CVA, follows up with Neuro in July.     HPI Patient is in today for BP check. Her BP was 188/109 at her last visit, and she was started on amlodipine.  She had CVA a little over a month ago, and has residual vision changes that have been preventing her from driving (neuro revoked driving privileges).  BP at home has been around 140/100. Denies adverse medication effects like dizziness.  Past Medical History:  Diagnosis Date  . Diabetes mellitus without complication ([place])   . Hyperlipidemia   . Hypertension   . Stroke [facility])    Has blurred vision    History reviewed. No pertinent surgical history.  History reviewed. No pertinent family history.  Social History   Socioeconomic History  . Marital status: Divorced    Spouse name: Not on file  . Number of children: Not on file  . Years of education: Not on file  . Highest education level: Not on file  Occupational History  . Occupation: [employer]    Comment: full time  Tobacco Use  . Smoking status: Never Smoker  . Smokeless tobacco: Never Used  Substance and Sexual Activity  . Alcohol use: Never  . Drug use: Never  . Sexual activity: Not Currently  Other Topics Concern  . Not on file  Social History Narrative   Lives alone   Right Handed   Drinks caffeine 3xweek   Social Determinants of Health   Financial Resource Strain: Not on file  Food Insecurity: Not on file  Transportation Needs: Not on file  Physical Activity: Not on file  Stress: Not on file  Social Connections: Not on file  Intimate Partner Violence: Not on file    Outpatient Medications Prior to Visit  Medication Sig Dispense Refill  . aspirin EC 81  MG tablet Take 1 tablet (81 mg total) by mouth daily. Swallow whole. 30 tablet 11  . atorvastatin (LIPITOR) 80 MG tablet Take 1 tablet (80 mg total) by mouth at bedtime. 90 tablet 1  . blood glucose meter kit and supplies Dispense based on patient and insurance preference. Use up to four times daily as directed. (FOR ICD-10 E10.9, E11.9). 1 each 0  . dapagliflozin propanediol (FARXIGA) 10 MG TABS tablet Take 1 tablet (10 mg total) by mouth daily before breakfast. 90 tablet 1  . glucose blood ([employer] GLUCOSE METER TEST STRIPS) test strip Test glucose  4 times per day- One Touch 100 each 2  . Lancets (ONETOUCH ULTRASOFT) lancets Use as instructed, can use up to 4 times per day. 100 each 12  . lisinopril (ZESTRIL) 20 MG tablet Take 1 tablet (20 mg total) by mouth daily. 30 tablet 1  . metFORMIN (GLUCOPHAGE) 500 MG tablet Take 1 tablet PO daily for 1 week, then take 1 tablet PO BID 180 tablet 3  . amLODipine (NORVASC) 5 MG tablet Take 1 tablet (5 mg total) by mouth daily. 30 tablet 1   No facility-administered medications prior to visit.    No Known Allergies  Review of Systems  Constitutional: Negative.   Eyes: Positive for visual disturbance.       Since  CVA  Respiratory: Negative.   Cardiovascular: Negative.   Neurological: Negative for tremors, facial asymmetry, numbness and headaches.  Psychiatric/Behavioral: Negative.        Objective:    Physical Exam Constitutional:      Appearance: Normal appearance.  Cardiovascular:     Rate and Rhythm: Normal rate and regular rhythm.     Pulses: Normal pulses.     Heart sounds: Normal heart sounds.  Pulmonary:     Effort: Pulmonary effort is normal.     Breath sounds: Normal breath sounds.  Neurological:     General: No focal deficit present.     Mental Status: She is alert and oriented to person, place, and time.  Psychiatric:        Mood and Affect: Mood normal.        Behavior: Behavior normal.        Thought Content: Thought  content normal.        Judgment: Judgment normal.     BP (!) 172/110   Pulse 90   Temp 98.8 F (37.1 C)   Resp 18   Ht 5' 2"  (1.575 m)   Wt 213 lb (96.6 kg)   SpO2 98%   BMI 38.96 kg/m  Wt Readings from Last 3 Encounters:  04/25/20 213 lb (96.6 kg)  04/24/20 211 lb (95.7 kg)  04/18/20 208 lb (94.3 kg)    Health Maintenance Due  Topic Date Due  . OPHTHALMOLOGY EXAM  Never done  . HIV Screening  Never done  . PAP SMEAR-Modifier  Never done  . COLONOSCOPY (Pts 45-44yr Insurance coverage will need to be confirmed)  Never done  . MAMMOGRAM  Never done    There are no preventive care reminders to display for this patient.   Lab Results  Component Value Date   TSH 1.010 04/08/2020   Lab Results  Component Value Date   WBC 6.8 03/21/2020   HGB 13.8 03/21/2020   HCT 43.3 03/21/2020   MCV 81 03/21/2020   PLT 234 03/21/2020   Lab Results  Component Value Date   NA 137 03/21/2020   K 3.6 03/21/2020   CO2 22 03/21/2020   GLUCOSE 295 (H) 03/21/2020   BUN 6 (L) 03/21/2020   CREATININE 0.82 03/21/2020   BILITOT 0.3 03/21/2020   ALKPHOS 75 03/21/2020   AST 28 03/21/2020   ALT 34 (H) 03/21/2020   PROT 6.8 03/21/2020   ALBUMIN 4.1 03/21/2020   CALCIUM 9.3 03/21/2020   Lab Results  Component Value Date   CHOL 128 04/08/2020   Lab Results  Component Value Date   HDL 52 04/08/2020   Lab Results  Component Value Date   LDLCALC 61 04/08/2020   Lab Results  Component Value Date   TRIG 73 04/08/2020   Lab Results  Component Value Date   CHOLHDL 2.5 04/08/2020   Lab Results  Component Value Date   HGBA1C 14.3 (H) 04/08/2020       Assessment & Plan:   Problem List Items Addressed This Visit      Cardiovascular and Mediastinum   Essential hypertension, benign    -INCREASE amlodipine d/t elevated BP BP Readings from Last 3 Encounters:  04/25/20 (!) 172/110  04/24/20 (!) 163/112  04/18/20 (!) 188/109  -she states home readings have been around  140/100 -she is also taking lisinopril 20 mg daily       Relevant Medications   amLODipine (NORVASC) 10 MG tablet     Other  Screening due - Primary    -referral to GYN for pap -she will check with her insurance to decide on colonoscopy vs cologuard      Relevant Orders   Ambulatory referral to Obstetrics / Gynecology       Meds ordered this encounter  Medications  . amLODipine (NORVASC) 10 MG tablet    Sig: Take 1 tablet (10 mg total) by mouth daily.    Dispense:  90 tablet    Refill:  3    Increased dose today     Noreene Larsson, NP

## 2020-04-25 NOTE — Assessment & Plan Note (Signed)
-  INCREASE amlodipine d/t elevated BP BP Readings from Last 3 Encounters:  04/25/20 (!) 172/110  04/24/20 (!) 163/112  04/18/20 (!) 188/109  -she states home readings have been around 140/100 -she is also taking lisinopril 20 mg daily

## 2020-04-25 NOTE — Assessment & Plan Note (Signed)
-  referral to GYN for pap -she will check with her insurance to decide on colonoscopy vs cologuard

## 2020-04-28 ENCOUNTER — Other Ambulatory Visit: Payer: Self-pay

## 2020-04-28 ENCOUNTER — Encounter: Payer: Self-pay | Admitting: Internal Medicine

## 2020-04-28 ENCOUNTER — Ambulatory Visit: Payer: Federal, State, Local not specified - PPO | Admitting: Internal Medicine

## 2020-04-28 VITALS — BP 144/92 | HR 102 | Ht 62.0 in | Wt 212.2 lb

## 2020-04-28 DIAGNOSIS — I639 Cerebral infarction, unspecified: Secondary | ICD-10-CM

## 2020-04-28 NOTE — Patient Instructions (Addendum)
Medication Instructions:  Your physician recommends that you continue on your current medications as directed. Please refer to the Current Medication list given to you today.  Labwork: None ordered.  Testing/Procedures: None ordered.  Follow-Up: Your physician wants you to follow-up in: As needed with Thompson Grayer, MD   Any Other Special Instructions Will Be Listed Below (If Applicable).  If you need a refill on your cardiac medications before your next appointment, please call your pharmacy.    Implantable Loop Recorder Placement, Care After This sheet gives you information about how to care for yourself after your procedure. Your health care provider may also give you more specific instructions. If you have problems or questions, contact your health care provider. What can I expect after the procedure? After the procedure, it is common to have:  Soreness or discomfort near the incision.  Some swelling or bruising near the incision. Follow these instructions at home: Incision care  Follow instructions from your health care provider about how to take care of your incision. Make sure you: ? Wash your hands with soap and water before you change your bandage (dressing). If soap and water are not available, use hand sanitizer. ? Change your dressing as told by your health care provider. ? Keep your dressing dry. ? Leave stitches (sutures), skin glue, or adhesive strips in place. These skin closures may need to stay in place for 2 weeks or longer. If adhesive strip edges start to loosen and curl up, you may trim the loose edges. Do not remove adhesive strips completely unless your health care provider tells you to do that.  Check your incision area every day for signs of infection. Check for: ? Redness, swelling, or pain. ? Fluid or blood. ? Warmth. ? Pus or a bad smell.  Do not take baths, swim, or use a hot tub until your health care provider approves. Ask your health care  provider if you can take showers.   Activity  Return to your normal activities as told by your health care provider. Ask your health care provider what activities are safe for you.  Do not drive for 24 hours if you were given a sedative during your procedure.   General instructions  Follow instructions from your health care provider about how to manage your implantable loop recorder and transmit the information. Learn how to activate a recording if this is necessary for your type of device.  Do not go through a metal detection gate, and do not let someone hold a metal detector over your chest. Show your ID card.  Do not have an MRI unless you check with your health care provider first.  Take over-the-counter and prescription medicines only as told by your health care provider.  Keep all follow-up visits as told by your health care provider. This is important. Contact a health care provider if:  You have redness, swelling, or pain around your incision.  You have a fever.  You have pain that is not relieved by your pain medicine.  You have triggered your device because of fainting (syncope) or because of a heartbeat that feels like it is racing, slow, fluttering, or skipping (palpitations). Get help right away if you have:  Chest pain.  Difficulty breathing. Summary  After the procedure, it is common to have soreness or discomfort near the incision.  Change your dressing as told by your health care provider.  Follow instructions from your health care provider about how to manage your implantable loop  recorder and transmit the information.  Keep all follow-up visits as told by your health care provider. This is important. This information is not intended to replace advice given to you by your health care provider. Make sure you discuss any questions you have with your health care provider. Document Revised: 02/19/2017 Document Reviewed: 02/19/2017 Elsevier Patient Education   2021 [employer].

## 2020-04-28 NOTE — Progress Notes (Signed)
Electrophysiology Office Note   Date:  04/28/2020   ID:  Martha Jensen, DOB Jun 02, 1960, MRN [id number]  PCP:  Noreene Larsson, NP    CC: stroke   History of Present Illness: Martha Jensen is a 60 y.o. female who presents today for electrophysiology evaluation.   She is referred by Dr Leonie Man for  Consideration of cardioembolic causes for her stroke. She was admitted to [facility] 03/12/20 with sudden onset of visual changes.  She had an MRI which showed R occipital cortical infarct.  CTA did not show any large vessel intracranial or extracranial stenosis.  2D echo was unrevealing. She has made good recovery.    Today, she denies symptoms of palpitations, chest pain, shortness of breath, orthopnea, PND, lower extremity edema, claudication, dizziness, presyncope, syncope, bleeding, or neurologic sequela. The patient is tolerating medications without difficulties and is otherwise without complaint today.    Past Medical History:  Diagnosis Date  . Diabetes mellitus without complication ([place])   . Hyperlipidemia   . Hypertension   . Stroke [facility])    Has blurred vision   No past surgical history on file.   Current Outpatient Medications  Medication Sig Dispense Refill  . amLODipine (NORVASC) 10 MG tablet Take 1 tablet (10 mg total) by mouth daily. 90 tablet 3  . aspirin EC 81 MG tablet Take 1 tablet (81 mg total) by mouth daily. Swallow whole. 30 tablet 11  . atorvastatin (LIPITOR) 80 MG tablet Take 1 tablet (80 mg total) by mouth at bedtime. 90 tablet 1  . blood glucose meter kit and supplies Dispense based on patient and insurance preference. Use up to four times daily as directed. (FOR ICD-10 E10.9, E11.9). 1 each 0  . dapagliflozin propanediol (FARXIGA) 10 MG TABS tablet Take 1 tablet (10 mg total) by mouth daily before breakfast. 90 tablet 1  . glucose blood ([employer] GLUCOSE METER TEST STRIPS) test strip Test glucose  4 times per day- One Touch 100 each 2  . Lancets (ONETOUCH  ULTRASOFT) lancets Use as instructed, can use up to 4 times per day. 100 each 12  . lisinopril (ZESTRIL) 20 MG tablet Take 1 tablet (20 mg total) by mouth daily. 30 tablet 1  . metFORMIN (GLUCOPHAGE) 500 MG tablet Take 1 tablet PO daily for 1 week, then take 1 tablet PO BID 180 tablet 3   No current facility-administered medications for this visit.    Allergies:   Patient has no known allergies.   Social History:  The patient  reports that she has never smoked. She has never used smokeless tobacco. She reports that she does not drink alcohol and does not use drugs.   Family History:  Denies FH of Afib   ROS:  Please see the history of present illness.   All other systems are personally reviewed and negative.    PHYSICAL EXAM: VS:  There were no vitals taken for this visit. , BMI There is no height or weight on file to calculate BMI. GEN: Well nourished, well developed, in no acute distress HEENT: normal Neck: no JVD  Cardiac: RRR  Respiratory:  normal work of breathing GI: soft  MS: no deformity or atrophy Skin: warm and dry  Neuro:  Strength and sensation are intact Psych: euthymic mood, full affect  EKG:  EKG is ordered today. The ekg ordered today is personally reviewed and shows sinus rhythm   Recent Labs: 03/21/2020: ALT 34; BUN 6; Creatinine, Ser 0.82; Hemoglobin 13.8;  Platelets 234; Potassium 3.6; Sodium 137 04/08/2020: TSH 1.010  personally reviewed   Lipid Panel     Component Value Date/Time   CHOL 128 04/08/2020 1407   TRIG 73 04/08/2020 1407   HDL 52 04/08/2020 1407   CHOLHDL 2.5 04/08/2020 1407   LDLCALC 61 04/08/2020 1407   personally reviewed   Wt Readings from Last 3 Encounters:  04/25/20 213 lb (96.6 kg)  04/24/20 211 lb (95.7 kg)  04/18/20 208 lb (94.3 kg)      Other studies personally reviewed: Additional studies/ records that were reviewed today include: Dr Lenell Antu notes  Review of the above records today demonstrates: as above  Assessment  and Plan:  1. Cryptogenic stroke The patient recently had cryptogenic stroke.  I share Dr Clydene Fake concerns for possible cardioembolic source.  We will arrange TEE.    I spoke at length with the patient about monitoring for afib with either a 30 day event monitor or an implantable loop recorder.  Risks, benefits, and alteratives to TEE and implantable loop recorder were discussed with the patient today.   At this time, the patient is very clear in their decision to avoid the procedure.  She wishes to think about it further.  If she changes her mind, she will contract my office.  She states "I need to have peace about this decision before I can move forward".    Army Fossa, MD  04/28/2020 9:08 AM     [facility] [address] [place] [address] [phone] (office) [phone] (fax)

## 2020-04-30 ENCOUNTER — Telehealth: Payer: Self-pay

## 2020-04-30 NOTE — Telephone Encounter (Signed)
Dx codes: I10, E11.65, I63.9 provided.

## 2020-04-30 NOTE — Telephone Encounter (Signed)
Per [employer]-the claim for 03-21-20 for [facility], needs another DX code other than Z76.89, please send to [facility]

## 2020-05-01 ENCOUNTER — Telehealth: Payer: Self-pay

## 2020-05-01 ENCOUNTER — Encounter: Payer: Self-pay | Admitting: *Deleted

## 2020-05-01 NOTE — Telephone Encounter (Signed)
[employer] called and needed DX corrected for DOS 03/21/20 for labs.  I gave [place] corrected DX codes from [place] and she will have [facility] rebill these labs.

## 2020-05-05 ENCOUNTER — Ambulatory Visit: Payer: Federal, State, Local not specified - PPO | Admitting: Nurse Practitioner

## 2020-05-12 ENCOUNTER — Telehealth: Payer: Self-pay

## 2020-05-12 NOTE — Telephone Encounter (Signed)
fmla forms received  Copied Noted Sleeved  

## 2020-05-16 ENCOUNTER — Other Ambulatory Visit: Payer: Self-pay

## 2020-05-16 ENCOUNTER — Encounter: Payer: Self-pay | Admitting: Nurse Practitioner

## 2020-05-16 ENCOUNTER — Ambulatory Visit: Payer: Federal, State, Local not specified - PPO | Admitting: Nurse Practitioner

## 2020-05-16 ENCOUNTER — Telehealth: Payer: Self-pay

## 2020-05-16 DIAGNOSIS — I10 Essential (primary) hypertension: Secondary | ICD-10-CM

## 2020-05-16 DIAGNOSIS — I639 Cerebral infarction, unspecified: Secondary | ICD-10-CM

## 2020-05-16 MED ORDER — HYDRALAZINE HCL 10 MG PO TABS: 10.0000 mg | ORAL_TABLET | Freq: Three times a day (TID) | ORAL | 1 refills | Status: DC

## 2020-05-16 NOTE — Progress Notes (Signed)
Acute Office Visit  Subjective:    Patient ID: Martha Jensen, female    DOB: 1960-05-30, 60 y.o.   MRN: [id number]  Chief Complaint  Patient presents with  . Hypertension    Continue HTN.     HPI Patient is in today for BP check.  At her last OV, her BP was elevated at 172/100 and she had been taking lisinopril. We increased her amlodipine to 10 mg daily.  Past Medical History:  Diagnosis Date  . Diabetes mellitus without complication ([place])   . Hyperlipidemia   . Hypertension   . Stroke [facility])    Has blurred vision    History reviewed. No pertinent surgical history.  History reviewed. No pertinent family history.  Social History   Socioeconomic History  . Marital status: Divorced    Spouse name: Not on file  . Number of children: Not on file  . Years of education: Not on file  . Highest education level: Not on file  Occupational History  . Occupation: [employer]    Comment: full time  Tobacco Use  . Smoking status: Never Smoker  . Smokeless tobacco: Never Used  Substance and Sexual Activity  . Alcohol use: Never  . Drug use: Never  . Sexual activity: Not Currently  Other Topics Concern  . Not on file  Social History Narrative   Lives alone   Right Handed   Drinks caffeine 3xweek   Social Determinants of Health   Financial Resource Strain: Not on file  Food Insecurity: Not on file  Transportation Needs: Not on file  Physical Activity: Not on file  Stress: Not on file  Social Connections: Not on file  Intimate Partner Violence: Not on file    Outpatient Medications Prior to Visit  Medication Sig Dispense Refill  . amLODipine (NORVASC) 10 MG tablet Take 1 tablet (10 mg total) by mouth daily. 90 tablet 3  . aspirin EC 81 MG tablet Take 1 tablet (81 mg total) by mouth daily. Swallow whole. 30 tablet 11  . atorvastatin (LIPITOR) 80 MG tablet Take 1 tablet (80 mg total) by mouth at bedtime. 90 tablet 1  . blood  glucose meter kit and supplies Dispense based on patient and insurance preference. Use up to four times daily as directed. (FOR ICD-10 E10.9, E11.9). 1 each 0  . dapagliflozin propanediol (FARXIGA) 10 MG TABS tablet Take 1 tablet (10 mg total) by mouth daily before breakfast. 90 tablet 1  . glucose blood ([employer]S GLUCOSE METER TEST STRIPS) test strip Test glucose  4 times per day- One Touch 100 each 2  . Lancets (ONETOUCH ULTRASOFT) lancets Use as instructed, can use up to 4 times per day. 100 each 12  . lisinopril (ZESTRIL) 20 MG tablet Take 1 tablet (20 mg total) by mouth daily. 30 tablet 1  . metFORMIN (GLUCOPHAGE) 500 MG tablet Take 1 tablet PO daily for 1 week, then take 1 tablet PO BID 180 tablet 3   No facility-administered medications prior to visit.    No Known Allergies  Review of Systems  Constitutional: Negative.   Respiratory: Negative.   Cardiovascular: Negative.   Neurological:       Vision changed since her CVA; unable to drive d/t vision changes  Psychiatric/Behavioral: Negative.        Objective:    Physical Exam Constitutional:      Appearance: Normal appearance.  Cardiovascular:     Rate and Rhythm: Normal rate and  regular rhythm.     Pulses: Normal pulses.     Heart sounds: Normal heart sounds.  Pulmonary:     Effort: Pulmonary effort is normal.     Breath sounds: Normal breath sounds.  Neurological:     General: No focal deficit present.     Mental Status: She is alert and oriented to person, place, and time. Mental status is at baseline.  Psychiatric:        Mood and Affect: Mood normal.        Behavior: Behavior normal.        Thought Content: Thought content normal.        Judgment: Judgment normal.     BP (!) 161/114   Pulse 91   Temp 98.1 F (36.7 C)   Resp 20   Ht 5' 2"  (1.575 m)   Wt 207 lb (93.9 kg)   SpO2 97%   BMI 37.86 kg/m  Wt Readings from Last 3 Encounters:  05/16/20 207 lb (93.9 kg)  04/28/20 212 lb 3.2 oz (96.3 kg)  04/25/20  213 lb (96.6 kg)    Health Maintenance Due  Topic Date Due  . OPHTHALMOLOGY EXAM  Never done  . HIV Screening  Never done  . PAP SMEAR-Modifier  Never done  . COLONOSCOPY (Pts 45-57yr Insurance coverage will need to be confirmed)  Never done  . MAMMOGRAM  Never done    There are no preventive care reminders to display for this patient.   Lab Results  Component Value Date   TSH 1.010 04/08/2020   Lab Results  Component Value Date   WBC 6.8 03/21/2020   HGB 13.8 03/21/2020   HCT 43.3 03/21/2020   MCV 81 03/21/2020   PLT 234 03/21/2020   Lab Results  Component Value Date   NA 137 03/21/2020   K 3.6 03/21/2020   CO2 22 03/21/2020   GLUCOSE 295 (H) 03/21/2020   BUN 6 (L) 03/21/2020   CREATININE 0.82 03/21/2020   BILITOT 0.3 03/21/2020   ALKPHOS 75 03/21/2020   AST 28 03/21/2020   ALT 34 (H) 03/21/2020   PROT 6.8 03/21/2020   ALBUMIN 4.1 03/21/2020   CALCIUM 9.3 03/21/2020   EGFR 81 03/21/2020   Lab Results  Component Value Date   CHOL 128 04/08/2020   Lab Results  Component Value Date   HDL 52 04/08/2020   Lab Results  Component Value Date   LDLCALC 61 04/08/2020   Lab Results  Component Value Date   TRIG 73 04/08/2020   Lab Results  Component Value Date   CHOLHDL 2.5 04/08/2020   Lab Results  Component Value Date   HGBA1C 14.3 (H) 04/08/2020       Assessment & Plan:   Problem List Items Addressed This Visit      Cardiovascular and Mediastinum   Essential hypertension, benign    BP Readings from Last 3 Encounters:  05/16/20 (!) 161/114  04/28/20 (!) 144/92  04/25/20 (!) 172/110   -BP still elevated today despite amlodipine and lisinopril -Rx. Hydralazine       Relevant Medications   hydrALAZINE (APRESOLINE) 10 MG tablet     Nervous and Auditory   Occipital infarction ([facility]    -followed by neurology- she states he revoked driving privileges d/t poor vision; recommended he complete the disability paperwork -has blurred vision  still; could be related to diabetic eye changes -needs appt with ophthalmology -pt refused PT/OT  Meds ordered this encounter  Medications  . hydrALAZINE (APRESOLINE) 10 MG tablet    Sig: Take 1 tablet (10 mg total) by mouth 3 (three) times daily.    Dispense:  90 tablet    Refill:  [place], NP

## 2020-05-16 NOTE — Assessment & Plan Note (Signed)
-  followed by neurology- she states he revoked driving privileges d/t poor vision; recommended he complete the disability paperwork -has blurred vision still; could be related to diabetic eye changes -needs appt with ophthalmology -pt refused PT/OT  

## 2020-05-16 NOTE — Telephone Encounter (Signed)
Hilda Blades from [facility] called and said the DX for the labs DOS 03/21/20 are incorrect.  Info given to [place] to correct.  Caryl Pina will give [place]ma correct DX # and Doylene Bode will call home billing office to correct and re-file.  Gaston Islam # at [facility].

## 2020-05-16 NOTE — Assessment & Plan Note (Signed)
BP Readings from Last 3 Encounters:  05/16/20 (!) 161/114  04/28/20 (!) 144/92  04/25/20 (!) 172/110   -BP still elevated today despite amlodipine and lisinopril -Rx. Hydralazine

## 2020-05-19 ENCOUNTER — Telehealth: Payer: Self-pay

## 2020-05-19 DIAGNOSIS — Z0279 Encounter for issue of other medical certificate: Secondary | ICD-10-CM

## 2020-05-19 NOTE — Telephone Encounter (Signed)
Complete

## 2020-05-19 NOTE — Telephone Encounter (Signed)
Attending phys statement-disability continuation form received  Noted Copied Sleeved

## 2020-05-20 ENCOUNTER — Ambulatory Visit (HOSPITAL_COMMUNITY)
Admission: RE | Admit: 2020-05-20 | Discharge: 2020-05-20 | Disposition: A | Discharge status: Home / Self Care | Payer: Federal, State, Local not specified - PPO | Source: Ambulatory Visit | Attending: Neurology | Admitting: Neurology

## 2020-05-20 ENCOUNTER — Other Ambulatory Visit: Payer: Self-pay

## 2020-05-20 DIAGNOSIS — I639 Cerebral infarction, unspecified: Secondary | ICD-10-CM | POA: Diagnosis not present

## 2020-05-20 NOTE — Progress Notes (Signed)
TCD w/ bubble study completed.   Please see CV Proc for preliminary results.   Sable Knoles, RDMS, RVT  

## 2020-05-21 ENCOUNTER — Ambulatory Visit: Payer: Federal, State, Local not specified - PPO | Admitting: Registered"

## 2020-05-21 DIAGNOSIS — I639 Cerebral infarction, unspecified: Secondary | ICD-10-CM | POA: Diagnosis not present

## 2020-05-21 DIAGNOSIS — H2513 Age-related nuclear cataract, bilateral: Secondary | ICD-10-CM | POA: Diagnosis not present

## 2020-05-21 DIAGNOSIS — E119 Type 2 diabetes mellitus without complications: Secondary | ICD-10-CM | POA: Diagnosis not present

## 2020-05-21 LAB — HM DIABETES EYE EXAM

## 2020-05-22 DIAGNOSIS — Z0279 Encounter for issue of other medical certificate: Secondary | ICD-10-CM

## 2020-05-29 ENCOUNTER — Ambulatory Visit (INDEPENDENT_AMBULATORY_CARE_PROVIDER_SITE_OTHER): Payer: Federal, State, Local not specified - PPO | Admitting: Nurse Practitioner

## 2020-05-29 ENCOUNTER — Other Ambulatory Visit: Payer: Self-pay

## 2020-05-29 ENCOUNTER — Encounter: Payer: Self-pay | Admitting: Nurse Practitioner

## 2020-05-29 DIAGNOSIS — I10 Essential (primary) hypertension: Secondary | ICD-10-CM | POA: Diagnosis not present

## 2020-05-29 NOTE — Patient Instructions (Signed)
Please have fasting labs drawn 2-3 days prior to your appointment so we can discuss the results during your office visit.  

## 2020-05-29 NOTE — Progress Notes (Signed)
Acute Office Visit  Subjective:    Patient ID: Martha Jensen, female    DOB: 10-Sep-1960, 60 y.o.   MRN: [id number]  Chief Complaint  Patient presents with  . Hypertension    HPI Patient is in today for BP check. At her last OV, her BP was 161/114, so we added hydralazine to her amlodipine and lisinopril.  She states her hom BP readings have been good, and at one point they got as low as 90/84.  She states she had some dizziness and felt off-balance, but this resolved and she is hydrating better.  She states her BP has been 125/95 and 132/85 recently.    Past Medical History:  Diagnosis Date  . Diabetes mellitus without complication ([place])   . Hyperlipidemia   . Hypertension   . Stroke [facility])    Has blurred vision    History reviewed. No pertinent surgical history.  History reviewed. No pertinent family history.  Social History   Socioeconomic History  . Marital status: Divorced    Spouse name: Not on file  . Number of children: Not on file  . Years of education: Not on file  . Highest education level: Not on file  Occupational History  . Occupation: [employer]    Comment: full time  Tobacco Use  . Smoking status: Never Smoker  . Smokeless tobacco: Never Used  Substance and Sexual Activity  . Alcohol use: Never  . Drug use: Never  . Sexual activity: Not Currently  Other Topics Concern  . Not on file  Social History Narrative   Lives alone   Right Handed   Drinks caffeine 3xweek   Social Determinants of Health   Financial Resource Strain: Not on file  Food Insecurity: Not on file  Transportation Needs: Not on file  Physical Activity: Not on file  Stress: Not on file  Social Connections: Not on file  Intimate Partner Violence: Not on file    Outpatient Medications Prior to Visit  Medication Sig Dispense Refill  . amLODipine (NORVASC) 10 MG tablet Take 1 tablet (10 mg total) by mouth daily. 90 tablet 3  . aspirin EC  81 MG tablet Take 1 tablet (81 mg total) by mouth daily. Swallow whole. 30 tablet 11  . atorvastatin (LIPITOR) 80 MG tablet Take 1 tablet (80 mg total) by mouth at bedtime. 90 tablet 1  . blood glucose meter kit and supplies Dispense based on patient and insurance preference. Use up to four times daily as directed. (FOR ICD-10 E10.9, E11.9). 1 each 0  . dapagliflozin propanediol (FARXIGA) 10 MG TABS tablet Take 1 tablet (10 mg total) by mouth daily before breakfast. 90 tablet 1  . glucose blood ([employer]S GLUCOSE METER TEST STRIPS) test strip Test glucose  4 times per day- One Touch 100 each 2  . hydrALAZINE (APRESOLINE) 10 MG tablet Take 1 tablet (10 mg total) by mouth 3 (three) times daily. 90 tablet 1  . Lancets (ONETOUCH ULTRASOFT) lancets Use as instructed, can use up to 4 times per day. 100 each 12  . lisinopril (ZESTRIL) 20 MG tablet Take 1 tablet (20 mg total) by mouth daily. 30 tablet 1  . metFORMIN (GLUCOPHAGE) 500 MG tablet Take 1 tablet PO daily for 1 week, then take 1 tablet PO BID 180 tablet 3   No facility-administered medications prior to visit.    No Known Allergies  Review of Systems  Constitutional: Negative.   Eyes:  Vision changes post-CVA; she states she saw Dr. Katy Fitch and there were no diabetic changes to her eyes.  Respiratory: Negative.   Cardiovascular: Negative.   Psychiatric/Behavioral: Negative.        Objective:    Physical Exam Constitutional:      Appearance: Normal appearance.  Cardiovascular:     Rate and Rhythm: Normal rate and regular rhythm.     Pulses: Normal pulses.     Heart sounds: Normal heart sounds.  Pulmonary:     Effort: Pulmonary effort is normal.     Breath sounds: Normal breath sounds.  Neurological:     Mental Status: She is alert.  Psychiatric:        Mood and Affect: Mood normal.        Behavior: Behavior normal.        Thought Content: Thought content normal.        Judgment: Judgment normal.     BP (!) 154/100   Pulse  94   Temp 98.4 F (36.9 C)   Resp 20   Ht $R'5\' 2"'Oj$  (1.575 m)   Wt 208 lb (94.3 kg)   SpO2 98%   BMI 38.04 kg/m  Wt Readings from Last 3 Encounters:  05/29/20 208 lb (94.3 kg)  05/16/20 207 lb (93.9 kg)  04/28/20 212 lb 3.2 oz (96.3 kg)    Health Maintenance Due  Topic Date Due  . COVID-19 Vaccine (1) Never done  . OPHTHALMOLOGY EXAM  Never done  . HIV Screening  Never done  . PAP SMEAR-Modifier  Never done  . COLONOSCOPY (Pts 45-93yrs Insurance coverage will need to be confirmed)  Never done  . MAMMOGRAM  Never done    There are no preventive care reminders to display for this patient.   Lab Results  Component Value Date   TSH 1.010 04/08/2020   Lab Results  Component Value Date   WBC 6.8 03/21/2020   HGB 13.8 03/21/2020   HCT 43.3 03/21/2020   MCV 81 03/21/2020   PLT 234 03/21/2020   Lab Results  Component Value Date   NA 137 03/21/2020   K 3.6 03/21/2020   CO2 22 03/21/2020   GLUCOSE 295 (H) 03/21/2020   BUN 6 (L) 03/21/2020   CREATININE 0.82 03/21/2020   BILITOT 0.3 03/21/2020   ALKPHOS 75 03/21/2020   AST 28 03/21/2020   ALT 34 (H) 03/21/2020   PROT 6.8 03/21/2020   ALBUMIN 4.1 03/21/2020   CALCIUM 9.3 03/21/2020   EGFR 81 03/21/2020   Lab Results  Component Value Date   CHOL 128 04/08/2020   Lab Results  Component Value Date   HDL 52 04/08/2020   Lab Results  Component Value Date   LDLCALC 61 04/08/2020   Lab Results  Component Value Date   TRIG 73 04/08/2020   Lab Results  Component Value Date   CHOLHDL 2.5 04/08/2020   Lab Results  Component Value Date   HGBA1C 14.3 (H) 04/08/2020       Assessment & Plan:   Problem List Items Addressed This Visit      Cardiovascular and Mediastinum   Essential hypertension, benign    -Home BPs have been great; had 1 low reading associated with dizziness -no increase in meds today -continue current treatments; may have white coat syndrome component to her office BP checks      Relevant  Orders   CBC with Differential/Platelet   CMP14+EGFR   Lipid Panel With LDL/HDL Ratio  No orders of the defined types were placed in this encounter.    Noreene Larsson, NP

## 2020-05-29 NOTE — Assessment & Plan Note (Signed)
-  Home BPs have been great; had 1 low reading associated with dizziness -no increase in meds today -continue current treatments; may have white coat syndrome component to her office BP checks

## 2020-06-02 ENCOUNTER — Other Ambulatory Visit: Payer: Self-pay | Admitting: Nurse Practitioner

## 2020-06-02 DIAGNOSIS — E1165 Type 2 diabetes mellitus with hyperglycemia: Secondary | ICD-10-CM

## 2020-06-02 DIAGNOSIS — I10 Essential (primary) hypertension: Secondary | ICD-10-CM

## 2020-06-10 ENCOUNTER — Other Ambulatory Visit (HOSPITAL_COMMUNITY)
Admission: RE | Admit: 2020-06-10 | Discharge: 2020-06-10 | Disposition: A | Discharge status: Home / Self Care | Payer: Federal, State, Local not specified - PPO | Source: Ambulatory Visit | Attending: Obstetrics & Gynecology | Admitting: Obstetrics & Gynecology

## 2020-06-10 ENCOUNTER — Encounter: Payer: Self-pay | Admitting: Adult Health

## 2020-06-10 ENCOUNTER — Ambulatory Visit: Payer: Federal, State, Local not specified - PPO | Admitting: Adult Health

## 2020-06-10 ENCOUNTER — Other Ambulatory Visit: Payer: Self-pay

## 2020-06-10 VITALS — BP 159/101 | HR 102 | Ht 61.5 in | Wt 208.0 lb

## 2020-06-10 DIAGNOSIS — Z01419 Encounter for gynecological examination (general) (routine) without abnormal findings: Secondary | ICD-10-CM

## 2020-06-10 DIAGNOSIS — N950 Postmenopausal bleeding: Secondary | ICD-10-CM | POA: Insufficient documentation

## 2020-06-10 DIAGNOSIS — Z1211 Encounter for screening for malignant neoplasm of colon: Secondary | ICD-10-CM

## 2020-06-10 LAB — HEMOCCULT GUIAC POC 1CARD (OFFICE): Fecal Occult Blood, POC: NEGATIVE

## 2020-06-10 NOTE — Progress Notes (Signed)
Patient ID: Martha Jensen, female   DOB: 1960-03-25, 60 y.o.   MRN: [id number] History of Present Illness: Martha Jensen is a 60 year old black female,divorced, in for pelvic and pap. She still has vaginal bleeding every 3-4 months, has never stopped for a whole year,she says periods were always irregular. She had Covid in January and Stroke in February. PCP is Donneta Romberg NP.   Current Medications, Allergies, Past Medical History, Past Surgical History, Family History and Social History were reviewed in [employer] record.     Review of Systems: Patient denies any headaches, hearing loss, fatigue, blurred vision, shortness of breath, chest pain, abdominal pain, problems with bowel movements, urination, or intercourse.(not active) No joint pain or mood swings. +vaginal bleeding, every 3-4 months    Physical Exam:BP (!) 159/101 (BP Location: Left Arm, Patient Position: Sitting, Cuff Size: Large)   Pulse (!) 102   Ht 5' 1.5" (1.562 m)   Wt 208 lb (94.3 kg)   BMI 38.66 kg/m  General:  Well developed, well nourished, no acute distress Skin:  Warm and dry Neck:  Midline trachea, normal thyroid, good ROM, no lymphadenopathy,no carotid bruits heard Lungs; Clear to auscultation bilaterally Cardiovascular: Regular rate and rhythm  Pelvic:  External genitalia is normal in appearance, no lesions.  The vagina is pale with loss of moisture and rugae Urethra has no lesions or masses. The cervix is smooth, pap with HR HPV genotyping performed. Uterus is felt to be normal size, shape, and contour.  No adnexal masses or tenderness noted.Bladder is non tender, no masses felt. Rectal: Good sphincter tone, no polyps, or hemorrhoids felt.  Hemoccult negative. Psych:  No mood changes, alert and cooperative,seems happy AA is 0 Fall risk is moderate PHQ 9 score is 1 GAD 7 score is 0  Upstream - 06/10/20 1121      Pregnancy Intention Screening   Does the patient want to become pregnant in  the next year? N/A    Does the patient's partner want to become pregnant in the next year? N/A    Would the patient like to discuss contraceptive options today? N/A      Contraception Wrap Up   Current Method Female Sterilization    End Method Female Sterilization    Contraception Counseling Provided No         Examination chaperoned by Levy Pupa LPN  Impression and Plan: 1. Encounter for gynecological examination with Papanicolaou smear of cervix Pap sent Physical with PCP Pap in 3 years if normal Mammogram yearly Colonoscopy advised Labs with PCP  2. Encounter for screening fecal occult blood testing   3. PMB (postmenopausal bleeding) Will get GYN [place] to assess uterus and ovaries I will see after [place] to go over results

## 2020-06-11 LAB — CYTOLOGY - PAP
Adequacy: ABSENT
Comment: NEGATIVE
Diagnosis: NEGATIVE
High risk HPV: NEGATIVE

## 2020-06-12 ENCOUNTER — Telehealth: Payer: Self-pay | Admitting: Adult Health

## 2020-06-12 MED ORDER — METRONIDAZOLE 500 MG PO TABS: 500.0000 mg | ORAL_TABLET | Freq: Two times a day (BID) | ORAL | 0 refills | Status: DC

## 2020-06-12 NOTE — Telephone Encounter (Signed)
Pt aware that pap is negative for malignancy and HPV but +BV, and she has noticed odor and itch, will rx flagyl, no alcohol while taking meds

## 2020-06-26 DIAGNOSIS — I639 Cerebral infarction, unspecified: Secondary | ICD-10-CM | POA: Diagnosis not present

## 2020-06-26 DIAGNOSIS — E119 Type 2 diabetes mellitus without complications: Secondary | ICD-10-CM | POA: Diagnosis not present

## 2020-06-26 DIAGNOSIS — H2513 Age-related nuclear cataract, bilateral: Secondary | ICD-10-CM | POA: Diagnosis not present

## 2020-07-02 ENCOUNTER — Encounter: Payer: Self-pay | Admitting: Adult Health

## 2020-07-02 ENCOUNTER — Ambulatory Visit (INDEPENDENT_AMBULATORY_CARE_PROVIDER_SITE_OTHER): Payer: Federal, State, Local not specified - PPO

## 2020-07-02 ENCOUNTER — Ambulatory Visit: Payer: Federal, State, Local not specified - PPO | Admitting: Adult Health

## 2020-07-02 ENCOUNTER — Other Ambulatory Visit: Payer: Self-pay

## 2020-07-02 VITALS — BP 137/87 | HR 86 | Ht 62.0 in | Wt 208.0 lb

## 2020-07-02 DIAGNOSIS — D219 Benign neoplasm of connective and other soft tissue, unspecified: Secondary | ICD-10-CM | POA: Insufficient documentation

## 2020-07-02 DIAGNOSIS — R9389 Abnormal findings on diagnostic imaging of other specified body structures: Secondary | ICD-10-CM

## 2020-07-02 DIAGNOSIS — N950 Postmenopausal bleeding: Secondary | ICD-10-CM | POA: Diagnosis not present

## 2020-07-02 DIAGNOSIS — I10 Essential (primary) hypertension: Secondary | ICD-10-CM | POA: Diagnosis not present

## 2020-07-02 NOTE — Progress Notes (Signed)
  Subjective:     Patient ID: Martha Jensen, female   DOB: Jun 07, 1960, 60 y.o.   MRN: [id number]  HPI Martha Jensen is a 60 year old black female,divorced in for [place] for PMB, she has vaginal bleeding every 3-4 months, has never stopped a whole year she says. PCP is Donneta Romberg NP.  Review of Systems +PMB Reviewed past medical,surgical, social and family history. Reviewed medications and allergies.     Objective:   Physical Exam BP 137/87 (BP Location: Left Arm, Patient Position: Sitting, Cuff Size: Large)   Pulse 86   Ht 5\' 2"  (1.575 m)   Wt 208 lb (94.3 kg)   BMI 38.04 kg/m  Skin warm and dry.  Lungs: clear to ausculation bilaterally. Cardiovascular: regular rate and rhythm.  US showed multiple fibroids and normal ovaries and EEC is thickened at 9.4 mm.    Fall risk is low  Upstream - 07/02/20 1002       Pregnancy Intention Screening   Does the patient want to become pregnant in the next year? N/A    Does the patient's partner want to become pregnant in the next year? N/A    Would the patient like to discuss contraceptive options today? N/A      Contraception Wrap Up   Current Method Female Sterilization    End Method Female Sterilization    Contraception Counseling Provided No             Assessment:     1. Thickened endometrium Will get endometrial biopsy with Dr Nelda Marseille next week, if possible  Review handout on endometrial biopsy Discussed biopsy is to rule out endometrial cancer,and if it is +, will need hysterectomy    2. PMB (postmenopausal bleeding)   3. Fibroids     Plan:     Will get endometrial biopsy

## 2020-07-02 NOTE — Progress Notes (Signed)
PELVIC US TA/TV: heterogeneous anteverted uterus with mult fibroids,(#1) posterior fundal subserosal fibroid 3.9 x 2.6 x 3.2 cm,(#2) fundal subserosal fibroid 2.2 x 2.1 x 1.9 cm,(#3) posterior LUS subserosal fibroid 2.2 x 2 x 2.1 cm,complex thickened endometrium with a color flow,EEC 9.4 mm,normal ovaries,ovaries appear mobile,no free fluid,no pain during ultrasound  Chaperone Peggy

## 2020-07-02 NOTE — Patient Instructions (Signed)
Endometrial Biopsy An endometrial biopsy is a procedure to remove tissue samples from the endometrium, which is the lining of the uterus. The tissue that is removed can then be checked under a microscope for disease. This procedure is used to diagnose conditions such as endometrial cancer, endometrial tuberculosis, polyps, or other inflammatory conditions. This procedure may also be used to investigate uterine bleeding to determine where you are in your menstrual cycle or how your hormone levels are affecting the lining of the uterus. Tell a health care provider about: Any allergies you have. All medicines you are taking, including vitamins, herbs, eye drops, creams, and over-the-counter medicines. Any problems you or family members have had with anesthetic medicines. Any blood disorders you have. Any surgeries you have had. Any medical conditions you have. Whether you are pregnant or may be pregnant. What are the risks? Generally, this is a safe procedure. However, problems may occur, including: Bleeding. Pelvic infection. Puncture of the wall of the uterus with the biopsy device (rare). Allergic reactions to medicines. What happens before the procedure? Keep a record of your menstrual cycles as told by your health care provider. You may need to schedule your procedure for a specific time in your cycle. You may want to bring a sanitary pad to wear after the procedure. Plan to have someone take you home from the hospital or clinic. Ask your health care provider about: Changing or stopping your regular medicines. This is especially important if you are taking diabetes medicines, arthritis medicines, or blood thinners. Taking medicines such as aspirin and ibuprofen. These medicines can thin your blood. Do not take these medicines unless your health care provider tells you to take them. Taking over-the-counter medicines, vitamins, herbs, and supplements. What happens during the procedure? You  will lie on an exam table with your feet and legs supported as in a pelvic exam. Your health care provider will insert an instrument (speculum) into your vagina to see your cervix. Your cervix will be cleansed with an antiseptic solution. A medicine (local anesthetic) will be used to numb the cervix. A forceps instrument (tenaculum) will be used to hold your cervix steady for the biopsy. A thin, rod-like instrument (uterine sound) will be inserted through your cervix to determine the length of your uterus and the location where the biopsy sample will be removed. A thin, flexible tube (catheter) will be inserted through your cervix and into the uterus. The catheter will be used to collect the biopsy sample from your endometrial tissue. The catheter and speculum will then be removed, and the tissue sample will be sent to a lab for examination. The procedure may vary among health care providers and hospitals. What can I expect after procedure? You will rest in a recovery area until you are ready to go home. You may have mild cramping and a small amount of vaginal bleeding. This is normal. You may have a small amount of vaginal bleeding for a few days. This is normal. It is up to you to get the results of your procedure. Ask your health care provider, or the department that is doing the procedure, when your results will be ready. Follow these instructions at home: Take over-the-counter and prescription medicines only as told by your health care provider. Do not douche, use tampons, or have sexual intercourse until your health care provider approves. Return to your normal activities as told by your health care provider. Ask your health care provider what activities are safe for you. Follow instructions   from your health care provider about any activity restrictions, such as restrictions on strenuous exercise or heavy lifting. Keep all follow-up visits. This is important. Contact a health care  provider: You have heavy bleeding, or bleed for longer than 2 days after the procedure. You have bad smelling discharge from your vagina. You have a fever or chills. You have a burning sensation when urinating or you have difficulty urinating. You have severe pain in your lower abdomen. Get help right away if you: You have severe cramps in your stomach or back. You pass large blood clots. Your bleeding increases. You become weak or light-headed, or you faint or lose consciousness. Summary An endometrial biopsy is a procedure to remove tissue samples is taken from the endometrium, which is the lining of the uterus. The tissue sample that is removed will be checked under a microscope for disease. This procedure is used to diagnose conditions such as endometrial cancer, endometrial tuberculosis, polyps, or other inflammatory conditions. After the procedure, it is common to have mild cramping and a small amount of vaginal bleeding for a few days. Do not douche, use tampons, or have sexual intercourse until your health care provider approves. Ask your health care provider which activities are safe for you. This information is not intended to replace advice given to you by your health care provider. Make sure you discuss any questions you have with your health care provider. Document Revised: 07/30/2019 Document Reviewed: 07/30/2019 Elsevier Patient Education  2022 Elsevier Inc.  

## 2020-07-03 LAB — CMP14+EGFR
ALT: 16 IU/L (ref 0–32)
AST: 17 IU/L (ref 0–40)
Albumin/Globulin Ratio: 1.6 (ref 1.2–2.2)
Albumin: 4.4 g/dL (ref 3.8–4.8)
Alkaline Phosphatase: 65 IU/L (ref 44–121)
BUN/Creatinine Ratio: 22 (ref 12–28)
BUN: 17 mg/dL (ref 8–27)
Bilirubin Total: 0.3 mg/dL (ref 0.0–1.2)
CO2: 21 mmol/L (ref 20–29)
Calcium: 9.3 mg/dL (ref 8.7–10.3)
Chloride: 105 mmol/L (ref 96–106)
Creatinine, Ser: 0.77 mg/dL (ref 0.57–1.00)
Globulin, Total: 2.7 g/dL (ref 1.5–4.5)
Glucose: 103 mg/dL — ABNORMAL HIGH (ref 65–99)
Potassium: 4.1 mmol/L (ref 3.5–5.2)
Sodium: 140 mmol/L (ref 134–144)
Total Protein: 7.1 g/dL (ref 6.0–8.5)
eGFR: 88 mL/min/{1.73_m2} (ref >59)

## 2020-07-03 LAB — LIPID PANEL WITH LDL/HDL RATIO
Cholesterol, Total: 144 mg/dL (ref 100–199)
HDL: 69 mg/dL (ref >39)
LDL Chol Calc (NIH): 62 mg/dL (ref 0–99)
LDL/HDL Ratio: 0.9 ratio (ref 0.0–3.2)
Triglycerides: 63 mg/dL (ref 0–149)
VLDL Cholesterol Cal: 13 mg/dL (ref 5–40)

## 2020-07-03 LAB — CBC WITH DIFFERENTIAL/PLATELET
Basophils Absolute: 0 10*3/uL (ref 0.0–0.2)
Basos: 0 %
EOS (ABSOLUTE): 0.1 10*3/uL (ref 0.0–0.4)
Eos: 1 %
Hematocrit: 41.9 % (ref 34.0–46.6)
Hemoglobin: 13 g/dL (ref 11.1–15.9)
Immature Grans (Abs): 0 10*3/uL (ref 0.0–0.1)
Immature Granulocytes: 0 %
Lymphocytes Absolute: 3.6 10*3/uL — ABNORMAL HIGH (ref 0.7–3.1)
Lymphs: 39 %
MCH: 25.6 pg — ABNORMAL LOW (ref 26.6–33.0)
MCHC: 31 g/dL — ABNORMAL LOW (ref 31.5–35.7)
MCV: 83 fL (ref 79–97)
Monocytes Absolute: 0.7 10*3/uL (ref 0.1–0.9)
Monocytes: 8 %
Neutrophils Absolute: 4.8 10*3/uL (ref 1.4–7.0)
Neutrophils: 52 %
Platelets: 312 10*3/uL (ref 150–450)
RBC: 5.07 x10E6/uL (ref 3.77–5.28)
RDW: 13.8 % (ref 11.7–15.4)
WBC: 9.3 10*3/uL (ref 3.4–10.8)

## 2020-07-03 NOTE — Progress Notes (Signed)
Labs are stable. Blood sugar looks much better.

## 2020-07-07 ENCOUNTER — Other Ambulatory Visit: Payer: Self-pay

## 2020-07-07 ENCOUNTER — Encounter: Payer: Self-pay | Admitting: Registered"

## 2020-07-07 ENCOUNTER — Encounter: Payer: Federal, State, Local not specified - PPO | Attending: Nurse Practitioner | Admitting: Registered"

## 2020-07-07 DIAGNOSIS — E1165 Type 2 diabetes mellitus with hyperglycemia: Secondary | ICD-10-CM | POA: Insufficient documentation

## 2020-07-07 NOTE — Progress Notes (Signed)
GYN VISIT Patient name: Martha Jensen MRN [id number]  Date of birth: [birth year] Chief Complaint:   Endometrial Biopsy  History of Present Illness:   Martha Jensen is a 60 y.o. [phone] female being seen today for the following concerns:  AUB: Last period January.  On occasion will have spotting- very sporadic.  Typically pink in color, just a few drops on her underwear.  This has been going on for the past several months. Menses were 2 days then go off for a few weeks then have another episode of bleeding for 4-5 days.  They have always been irregular.  Denies pelvic pain.  Since recently starting on DM medicine- noted occasional vaginal itching.  Denies discharge- that improved s/p treatment for BV.   Recent TVUS 07/02/20: 7.3cm heterogeneous anteverted uterus with mult fibroids,(#1) posterior fundal subserosal fibroid 3.9 x 2.6 x 3.2 cm,(#2) fundal subserosal fibroid 2.2 x 2.1 x 1.9 cm,(#3) posterior LUS subserosal fibroid 2.2 x 2 x 2.1 cm,complex thickened endometrium with a color flow,EEC 9.4 mm,normal ovaries,ovaries appear mobile,no free fluid,no pain during ultrasound.     No LMP recorded. (Menstrual status: Irregular Periods).  Depression screen [facility] 2/9 06/10/2020 05/29/2020 05/16/2020 04/25/2020 04/18/2020  Decreased Interest 0 0 0 0 0  Down, Depressed, Hopeless 0 0 1 0 0  PHQ - 2 Score 0 0 1 0 0  Altered sleeping 1 - - - -  Tired, decreased energy 0 - - - -  Change in appetite 0 - - - -  Feeling bad or failure about yourself  0 - - - -  Trouble concentrating 0 - - - -  Moving slowly or fidgety/restless 0 - - - -  Suicidal thoughts 0 - - - -  PHQ-9 Score 1 - - - -     Review of Systems:   Pertinent items are noted in HPI Denies fever/chills, dizziness, headaches, visual disturbances, fatigue, shortness of breath, chest pain, abdominal pain, vomiting, bowel movements, urination, or intercourse unless otherwise stated above.  Pertinent History Reviewed:  Reviewed past  medical,surgical, social, obstetrical and family history.  Reviewed problem list, medications and allergies. Physical Assessment:   Vitals:   07/11/20 0837  BP: (!) 155/98  Pulse: 87  Weight: 207 lb 9.6 oz (94.2 kg)  Height: 5\' 2"  (1.575 m)  Body mass index is 37.97 kg/m.       Physical Examination:   General appearance: alert, well appearing, and in no distress  Psych: mood appropriate, normal affect  Skin: warm & dry   Cardiovascular: normal heart rate noted  Respiratory: normal respiratory effort, no distress  Abdomen: soft, non-tender   Pelvic: VULVA: normal appearing vulva with no masses, tenderness or lesions, VAGINA: normal appearing vagina with normal color and discharge, no lesions, CERVIX: normal appearing cervix without discharge or lesions, UTERUS: uterus is normal size, shape, consistency and nontender, ADNEXA: normal adnexa in size, nontender and no masses  Extremities: no edema   Chaperone: Celene Squibb     Endometrial Biopsy Procedure Note  Pre-operative Diagnosis: AUB  Post-operative Diagnosis: same  Procedure Details  The risks (including infection, bleeding, pain, and uterine perforation) and benefits of the procedure were explained to the patient and Written informed consent was obtained.  Antibiotic prophylaxis against endocarditis was not indicated.   The patient was placed in the dorsal lithotomy position.  Bimanual exam showed the uterus to be in the neutral position.  A speculum inserted in the vagina, and the cervix prepped with  betadine.     A single tooth tenaculum was applied to the anterior lip of the cervix for stabilization.  A sterile uterine sound was used to sound the uterus to a depth of 7cm.  A Pipelle endometrial aspirator was used to sample the endometrium.  Sample was sent for pathologic examination, minimal tissue obtained  Condition: Stable  Complications: None  Plan: Next step pending results of pathology.  Suspect benign  findings. The patient was advised to call for any fever or for prolonged or severe pain or bleeding. She was advised to use OTC analgesics as needed for mild to moderate pain   Return if symptoms worsen or fail to improve, for annual in 52yr.   Janyth Pupa, DO Attending [place], [place] for [employer], [place]

## 2020-07-07 NOTE — Patient Instructions (Signed)
Aim to eat balanced meals about 30 grams carbs Include a variety of vegetables, both raw and cooked Continue checking your blood sugar, bring log to next appointment.

## 2020-07-07 NOTE — Progress Notes (Signed)
Diabetes Self-Management Education  Visit Type: First/Initial  Appt. Start Time: 0930 Appt. End Time: 0350  07/07/2020  [name], identified by name and date of birth, is a 60 y.o. female with a diagnosis of Diabetes: Type 2.   ASSESSMENT  There were no vitals taken for this visit. There is no height or weight on file to calculate BMI.  A1c 14.3%  DM Medications: Metformin 500 mg bid, Farxiga at breakfast (Per MD notes incretin therapy may be considered at next visit)  Pt reports FBS within target. Patient is not checking post prandial. Pt states she usually eats at 7pm and checks blood sugar ~10:30 with typical reading of 133 mg/dL.  Pt states she found out that she had diabetes after she had a stroke this year. Pt states her vision is still affected and is not able to drive. Pt states changes she made include no sugar drinks or candy, is not baking cakes anymore. Pt states she loves chips, popcorn and ice cream.  Pt states for breakfast she was eating omlette with veggies and cheese but for the last 3 weeks has switch to a meal replacement product Ka'chava.   Pt states she would like to know what foods are good to eat for diabetes. Pt also states she wants to know what affects morning blood sugar.    Diabetes Self-Management Education - 07/07/20 0942       Visit Information   Visit Type First/Initial      Initial Visit   Diabetes Type Type 2    Are you currently following a meal plan? No    Date Diagnosed 03/2020      Health Coping   How would you rate your overall health? Good      Psychosocial Assessment   Patient Belief/Attitude about Diabetes Motivated to manage diabetes    How often do you need to have someone help you when you read instructions, pamphlets, or other written materials from your doctor or pharmacy? 1 - Never    What is the last grade level you completed in school? 4 yrs college      Complications   Last HgB A1C per patient/outside source  14.3 %    How often do you check your blood sugar? 1-2 times/day    Fasting Blood glucose range (mg/dL) 70-129   103   Postprandial Blood glucose range (mg/dL) --   133 at night   Number of hypoglycemic episodes per month 0    Have you had a dilated eye exam in the past 12 months? Yes    Have you had a dental exam in the past 12 months? No    Are you checking your feet? Yes    How many days per week are you checking your feet? 7      Dietary Intake   Breakfast Kachava    Snack (morning) nuts, dry cold cereal    Lunch salmon, cabbage, red potatoes    Snack (afternoon) nuts OR fruit    Dinner frozen tyson grilled chicken strips, potato    Snack (evening) ice cream 1-2x/week OR popcorn OR chips and guacomole    Beverage(s) 72 oz water, 2x week tea unsweet tea with some sweet tea      Exercise   Exercise Type Light (walking / raking leaves)    How many days per week to you exercise? 4    How many minutes per day do you exercise? 15    Total minutes per  week of exercise 60      Patient Education   Previous Diabetes Education No    Nutrition management  Role of diet in the treatment of diabetes and the relationship between the three main macronutrients and blood glucose level    Medications Reviewed patients medication for diabetes, action, purpose, timing of dose and side effects.    Monitoring Identified appropriate SMBG and/or A1C goals.    Chronic complications Assessed and discussed foot care and prevention of foot problems      Individualized Goals (developed by patient)   Nutrition General guidelines for healthy choices and portions discussed    Monitoring  test my blood glucose as discussed      Outcomes   Expected Outcomes Demonstrated interest in learning. Expect positive outcomes    Future DMSE 2 months    Program Status Completed             Individualized Plan for Diabetes Self-Management Training:   Learning Objective:  Patient will have a greater  understanding of diabetes self-management. Patient education plan is to attend individual and/or group sessions per assessed needs and concerns.   Patient Instructions  Aim to eat balanced meals about 30 grams carbs Include a variety of vegetables, both raw and cooked Continue checking your blood sugar, bring log to next appointment.  Expected Outcomes:  Demonstrated interest in learning. Expect positive outcomes  Education material provided: ADA - How to Thrive: A Guide for Your Journey with Diabetes, A1C conversion sheet, My Plate, and Carbohydrate counting sheet  If problems or questions, patient to contact team via:  Phone  Future DSME appointment: 2 months

## 2020-07-10 ENCOUNTER — Ambulatory Visit: Payer: Federal, State, Local not specified - PPO | Admitting: Nurse Practitioner

## 2020-07-11 ENCOUNTER — Other Ambulatory Visit (HOSPITAL_COMMUNITY)
Admission: RE | Admit: 2020-07-11 | Discharge: 2020-07-11 | Disposition: A | Discharge status: Home / Self Care | Payer: Federal, State, Local not specified - PPO | Source: Ambulatory Visit | Attending: Obstetrics & Gynecology | Admitting: Obstetrics & Gynecology

## 2020-07-11 ENCOUNTER — Ambulatory Visit: Payer: Federal, State, Local not specified - PPO | Admitting: Obstetrics & Gynecology

## 2020-07-11 ENCOUNTER — Encounter: Payer: Self-pay | Admitting: Obstetrics & Gynecology

## 2020-07-11 ENCOUNTER — Other Ambulatory Visit: Payer: Self-pay

## 2020-07-11 VITALS — BP 155/98 | HR 87 | Ht 62.0 in | Wt 207.6 lb

## 2020-07-11 DIAGNOSIS — N939 Abnormal uterine and vaginal bleeding, unspecified: Secondary | ICD-10-CM | POA: Diagnosis not present

## 2020-07-11 DIAGNOSIS — N8501 Benign endometrial hyperplasia: Secondary | ICD-10-CM | POA: Diagnosis not present

## 2020-07-14 LAB — SURGICAL PATHOLOGY

## 2020-07-15 ENCOUNTER — Telehealth: Payer: Self-pay | Admitting: Obstetrics & Gynecology

## 2020-07-15 ENCOUNTER — Ambulatory Visit: Payer: Federal, State, Local not specified - PPO | Admitting: Nurse Practitioner

## 2020-07-15 ENCOUNTER — Encounter: Payer: Self-pay | Admitting: Nurse Practitioner

## 2020-07-15 ENCOUNTER — Other Ambulatory Visit: Payer: Self-pay

## 2020-07-15 DIAGNOSIS — E1165 Type 2 diabetes mellitus with hyperglycemia: Secondary | ICD-10-CM | POA: Diagnosis not present

## 2020-07-15 DIAGNOSIS — I10 Essential (primary) hypertension: Secondary | ICD-10-CM | POA: Diagnosis not present

## 2020-07-15 DIAGNOSIS — I639 Cerebral infarction, unspecified: Secondary | ICD-10-CM | POA: Diagnosis not present

## 2020-07-15 DIAGNOSIS — E782 Mixed hyperlipidemia: Secondary | ICD-10-CM

## 2020-07-15 MED ORDER — LISINOPRIL 40 MG PO TABS: 40.0000 mg | ORAL_TABLET | Freq: Every day | ORAL | 3 refills | Status: AC

## 2020-07-15 NOTE — Telephone Encounter (Signed)
Tried calling patient to get her scheduled for a lab result appointment, but patient's phone went straight to Voicemail and her Voicemail is not setup. ~DR

## 2020-07-15 NOTE — Assessment & Plan Note (Signed)
Lab Results  Component Value Date   CHOL 144 07/02/2020   HDL 69 07/02/2020   LDLCALC 62 07/02/2020   TRIG 63 07/02/2020   CHOLHDL 2.5 04/08/2020   -goal LDL < 70, so she is at goal

## 2020-07-15 NOTE — Assessment & Plan Note (Signed)
-  will fill out continuation of short-term disability paperwork -she states she is working on permanent disability vs spousal veteran pension from ex-husband

## 2020-07-15 NOTE — Patient Instructions (Signed)
For legal issues, call Rulon Abide with [employer] [phone]. He can get you pointed in the right direction for a local attorney for the disability or pension issues.

## 2020-07-15 NOTE — Assessment & Plan Note (Signed)
Lab Results  Component Value Date   HGBA1C 14.3 (H) 04/08/2020   -she was referred to Dr. Dorris Fetch for DM management

## 2020-07-15 NOTE — Progress Notes (Signed)
Established Patient Office Visit  Subjective:  Patient ID: Martha Jensen, female    DOB: 1960/09/28  Age: 60 y.o. MRN: [id number]  CC:  Chief Complaint  Patient presents with   Hypertension    Follow up    HPI Martha Jensen presents for lab follow-up. At her last OV, she reported some low home BP readings with dizziness, so we made no changes to her BP meds. She may have some white coat syndrome.  She has seen Dr. Katy Fitch x2, and she has upcoming appt in October.  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    Comment: full time  Tobacco Use   Smoking status: Never   Smokeless tobacco: Never  Vaping Use   Vaping Use: Never used  Substance and Sexual Activity   Alcohol use: Not Currently   Drug use: Never   Sexual activity: Not Currently    Birth control/protection: Surgical    Comment: tubal  Other Topics Concern   Not on file  Social History Narrative   Lives alone   Right Handed   Drinks caffeine 3xweek   Social Determinants of Health   Financial Resource Strain: Low Risk    Difficulty of Paying Living Expenses: Not hard at all  Food Insecurity: No Food Insecurity   Worried About Charity fundraiser in the Last Year: Never true   [place] in the Last Year: Never true  Transportation Needs: No Transportation Needs   Lack of Transportation (Medical): No   Lack of Transportation (Non-Medical): No   Physical Activity: Insufficiently Active   Days of Exercise per Week: 4 days   Minutes of Exercise per Session: 10 min  Stress: No Stress Concern Present   Feeling of Stress : Not at all  Social Connections: Moderately Isolated   Frequency of Communication with Friends and Family: More than three times a week   Frequency of Social Gatherings with Friends and Family: Once a week   Attends Religious Services: 1 to 4 times per year   Active Member of [employer] or Organizations: No   Attends Archivist Meetings: Never   Marital Status: Widowed  Human resources officer Violence: Not At Risk   Fear of Current or Ex-Partner: No   Emotionally Abused: No   Physically Abused: No   Sexually Abused: No    Outpatient Medications Prior to Visit  Medication Sig Dispense Refill   amLODipine (NORVASC) 10 MG tablet Take 1 tablet (10 mg total) by mouth daily. 90 tablet 3   aspirin EC 81 MG tablet Take 1 tablet (81 mg total) by mouth daily. Swallow whole. 30 tablet 11   atorvastatin (LIPITOR) 80 MG tablet Take 1 tablet (80 mg total) by mouth at bedtime. 90 tablet 1   blood glucose meter kit and supplies  Dispense based on patient and insurance preference. Use up to four times daily as directed. (FOR ICD-10 E10.9, E11.9). 1 each 0   CALCIUM PO Take by mouth at bedtime.     dapagliflozin propanediol (FARXIGA) 10 MG TABS tablet Take 1 tablet (10 mg total) by mouth daily before breakfast. 90 tablet 1   glucose blood ([employer] GLUCOSE METER TEST STRIPS) test strip Test glucose  4 times per day- One Touch 100 each 2   hydrALAZINE (APRESOLINE) 10 MG tablet Take 1 tablet (10 mg total) by mouth 3 (three) times daily. 90 tablet 1   Lancets (ONETOUCH ULTRASOFT) lancets Use as instructed, can use up to 4 times per day. 100 each 12   metFORMIN (GLUCOPHAGE) 500 MG tablet Take 1 tablet PO daily for 1 week, then take 1 tablet PO BID 180 tablet 3   lisinopril (ZESTRIL) 20 MG tablet TAKE 1 TABLET BY MOUTH EVERY DAY 30 tablet 1    No facility-administered medications prior to visit.    No Known Allergies  ROS Review of Systems  Constitutional: Negative.   Eyes:  Positive for visual disturbance.       Blurred vision since her CVA  Respiratory: Negative.    Cardiovascular: Negative.   Neurological: Negative.   Psychiatric/Behavioral: Negative.       Objective:    Physical Exam Constitutional:      Appearance: Normal appearance.  Cardiovascular:     Rate and Rhythm: Normal rate and regular rhythm.     Pulses: Normal pulses.     Heart sounds: Normal heart sounds.  Pulmonary:     Effort: Pulmonary effort is normal.     Breath sounds: Normal breath sounds.  Neurological:     Mental Status: She is alert.  Psychiatric:        Mood and Affect: Mood normal.        Behavior: Behavior normal.        Thought Content: Thought content normal.        Judgment: Judgment normal.    BP (!) 152/97 (BP Location: Left Arm, Patient Position: Sitting, Cuff Size: Large)   Pulse 90   Temp (!) 97.1 F (36.2 C) (Temporal)   Ht 5' 2"  (1.575 m)   Wt 211 lb (95.7 kg)   SpO2 98%   BMI 38.59 kg/m  Wt Readings from Last 3 Encounters:  07/15/20 211 lb (95.7 kg)  07/11/20 207 lb 9.6 oz (94.2 kg)  07/02/20 208 lb (94.3 kg)     Health Maintenance Due  Topic Date Due   HIV Screening  Never done   COLONOSCOPY (Pts 45-46yr Insurance coverage will need to be confirmed)  Never done   MAMMOGRAM  Never done   Zoster Vaccines- Shingrix (1 of 2) Never done    There are no preventive care reminders to display for this patient.  Lab Results  Component Value Date   TSH 1.010 04/08/2020   Lab Results  Component Value Date   WBC 9.3 07/02/2020   HGB 13.0 07/02/2020   HCT 41.9 07/02/2020   MCV 83 07/02/2020   PLT 312 07/02/2020   Lab Results  Component Value Date   NA 140 07/02/2020   K 4.1 07/02/2020   CO2 21 07/02/2020   GLUCOSE 103 (H) 07/02/2020   BUN 17 07/02/2020   CREATININE 0.77 07/02/2020   BILITOT  0.3 07/02/2020   ALKPHOS 65 07/02/2020   AST 17 07/02/2020   ALT 16 07/02/2020   PROT 7.1 07/02/2020  ALBUMIN 4.4 07/02/2020   CALCIUM 9.3 07/02/2020   EGFR 88 07/02/2020   Lab Results  Component Value Date   CHOL 144 07/02/2020   Lab Results  Component Value Date   HDL 69 07/02/2020   Lab Results  Component Value Date   LDLCALC 62 07/02/2020   Lab Results  Component Value Date   TRIG 63 07/02/2020   Lab Results  Component Value Date   CHOLHDL 2.5 04/08/2020   Lab Results  Component Value Date   HGBA1C 14.3 (H) 04/08/2020      Assessment & Plan:   Problem List Items Addressed This Visit       Cardiovascular and Mediastinum   Essential hypertension, benign    BP Readings from Last 3 Encounters:  07/15/20 (!) 152/97  07/11/20 (!) 155/98  07/02/20 137/87         Relevant Medications   lisinopril (ZESTRIL) 40 MG tablet     Endocrine   Type II diabetes mellitus, uncontrolled (HCC)    Lab Results  Component Value Date   HGBA1C 14.3 (H) 04/08/2020  -she was referred to Dr. Dorris Fetch for DM management       Relevant Medications   lisinopril (ZESTRIL) 40 MG tablet     Nervous and Auditory   Occipital infarction [facility])    -will fill out continuation of short-term disability paperwork -she states she is working on permanent disability vs spousal veteran pension from ex-husband         Other   Mixed hyperlipidemia    Lab Results  Component Value Date   CHOL 144 07/02/2020   HDL 69 07/02/2020   LDLCALC 62 07/02/2020   TRIG 63 07/02/2020   CHOLHDL 2.5 04/08/2020  -goal LDL < 70, so she is at goal       Relevant Medications   lisinopril (ZESTRIL) 40 MG tablet    Meds ordered this encounter  Medications   lisinopril (ZESTRIL) 40 MG tablet    Sig: Take 1 tablet (40 mg total) by mouth daily.    Dispense:  90 tablet    Refill:  3    Increasing dose today    Follow-up: Return in about 1 month (around 08/14/2020) for BP check.    Noreene Larsson, NP

## 2020-07-15 NOTE — Assessment & Plan Note (Signed)
BP Readings from Last 3 Encounters:  07/15/20 (!) 152/97  07/11/20 (!) 155/98  07/02/20 137/87

## 2020-07-17 ENCOUNTER — Telehealth: Payer: Self-pay | Admitting: Obstetrics & Gynecology

## 2020-07-17 NOTE — Telephone Encounter (Signed)
Patient called  stating that she had a Biopsy done on 07/11/2020 with Dr. Nelda Marseille and she would like to  know the results. Please contact pt

## 2020-07-22 ENCOUNTER — Telehealth: Payer: Self-pay | Admitting: Obstetrics & Gynecology

## 2020-07-22 ENCOUNTER — Encounter: Payer: Self-pay | Admitting: Obstetrics & Gynecology

## 2020-07-22 NOTE — Telephone Encounter (Signed)
Tried calling the patient to get her scheduled for a F/U ultrasound and have not been able to get in touch with the patient her voicemail is not setup. I will be sending her a certified letter to give [place] a call back and schedule an appointment

## 2020-07-23 ENCOUNTER — Other Ambulatory Visit: Payer: Self-pay | Admitting: Nurse Practitioner

## 2020-07-25 ENCOUNTER — Telehealth: Payer: Self-pay | Admitting: Obstetrics & Gynecology

## 2020-07-25 ENCOUNTER — Telehealth: Payer: Self-pay

## 2020-07-25 NOTE — Telephone Encounter (Signed)
Returned pt's call to inform her of the discussion with Dr Elonda Husky. Pt will be seen in the office to discuss her biopsy results as scheduled.

## 2020-07-25 NOTE — Telephone Encounter (Signed)
Patient called stating that she had a biopsy done not to long ago and would like to know the results. Please contact pt

## 2020-07-25 NOTE — Telephone Encounter (Signed)
Disability Forms  Copied Noted sleeved

## 2020-07-25 NOTE — Telephone Encounter (Signed)
Correction mark # 5 only. Due by 07/29/2020 to be able to get credit.

## 2020-07-25 NOTE — Telephone Encounter (Signed)
She ahs an appt with Dr Nelda Marseille, per our conversation give her a preview of her path but keep keep kepp her appt with Dr Nelda Marseille

## 2020-07-28 ENCOUNTER — Other Ambulatory Visit: Payer: Self-pay

## 2020-07-28 ENCOUNTER — Encounter: Payer: Self-pay | Admitting: Neurology

## 2020-07-28 ENCOUNTER — Ambulatory Visit: Payer: Federal, State, Local not specified - PPO | Admitting: Neurology

## 2020-07-28 VITALS — BP 137/88 | HR 96 | Ht 62.0 in | Wt 212.5 lb

## 2020-07-28 DIAGNOSIS — G3184 Mild cognitive impairment, so stated: Secondary | ICD-10-CM | POA: Diagnosis not present

## 2020-07-28 DIAGNOSIS — I639 Cerebral infarction, unspecified: Secondary | ICD-10-CM

## 2020-07-28 MED ORDER — COQ10 200 MG PO CAPS: 1.0000 | ORAL_CAPSULE | ORAL | 1 refills | Status: AC

## 2020-07-28 NOTE — Patient Instructions (Signed)
I had a long d/w patient  and her daughter about her recent stroke, risk for recurrent stroke/TIAs, personally independently reviewed imaging studies and stroke evaluation results and answered questions.Continue aspirin 81 mg daily  for secondary stroke prevention and maintain strict control of hypertension with blood pressure goal below 130/90, diabetes with hemoglobin A1c goal below 6.5% and lipids with LDL cholesterol goal below 70 mg/dL. I also advised the patient to eat a healthy diet with plenty of whole grains, cereals, fruits and vegetables, exercise regularly and maintain ideal body weight .I advised the patient to increase participation in cognitively challenging activities like solving crossword puzzles, playing bridge and sodoku.  We also discussed memory compensation strategies.  She may also consider possible participation in the [place] stroke prevention trial if interested and will be given information to review and decide.  Followup in the future with my nurse practitioner Janett Billow in 6 months or call earlier if necessary. Memory Compensation Strategies  Use "WARM" strategy.  W= write it down  A= associate it  R= repeat it  M= make a mental note  2.   You can keep a Social worker.  Use a 3-ring notebook with sections for the following: calendar, important names and phone numbers,  medications, doctors' names/phone numbers, lists/reminders, and a section to journal what you did  each day.   3.    Use a calendar to write appointments down.  4.    Write yourself a schedule for the day.  This can be placed on the calendar or in a separate section of the Memory Notebook.  Keeping a  regular schedule can help memory.  5.    Use medication organizer with sections for each day or morning/evening pills.  You may need help loading it  6.    Keep a basket, or pegboard by the door.  Place items that you need to take out with you in the basket or on the pegboard.  You may also want to   include a message board for reminders.  7.    Use sticky notes.  Place sticky notes with reminders in a place where the task is performed.  For example: " turn off the  stove" placed by the stove, "lock the door" placed on the door at eye level, " take your medications" on  the bathroom mirror or by the place where you normally take your medications.  8.    Use alarms/timers.  Use while cooking to remind yourself to check on food or as a reminder to take your medicine, or as a  reminder to make a call, or as a reminder to perform another task, etc.

## 2020-07-28 NOTE — Progress Notes (Signed)
[facility] [address] [place]. [place] [address] [phone]       OFFICEFOLLOW UP VISIT NOTE  [name] Date of Birth:  1960-07-18 Medical Record Number:  [id number]   Referring MD: Demetrius Revel, NP  Reason for Referral: Stroke HPI: Initial visit 04/08/2020 [name] is a 60 year old African-American lady seen today for initial office consultation visit for stroke.  History is obtained from the patient and review of electronic medical records in [place].  I reviewed pertinent imaging results the actual films were not available for review today.  She has past medical history for diabetes, hypertension, hyperlipidemia who was admitted to [facility] on 03/12/2020 with sudden onset of visual disturbance which she described as disco lights type of vision with her body summitted restlessly being on fire from the armpits to the bottom of the toes.  These episodes lasted a few minutes to a maximum of 10 minutes and occurred several times.  She denied any accompanying headache slurred speech extremity weakness numbness.  She had an MRI scan of the brain done which showed right occipital cortical infarct.  CT angiogram of the brain and neck were done which showed no significant large vessel intracranial extracranial stenosis.  2D echo showed ejection fraction of 65 to 70% without cardiac source of embolism.  LDL cholesterol is elevated 169 mg percent.  Hemoglobin A1c report could not be obtained by me but the patient told me it was elevated at 16.  She was started on aspirin 325 mg daily as well as Lipitor 80 mg.  She states the visual disturbances have now resolved but she has noticed slight peripheral vision loss on the left which has persisted.  Patient had Covid infection in January 28 and had just gone to work 10 days later when she started not feeling well and her symptoms gradually worsened then she developed his visual symptoms.  Patient is currently  tolerating aspirin well without upset stomach, bruising or bleeding.  Her blood pressure has been difficult to control and primary care physician increase her medications yesterday and yet it is 150/95 today in my office.  Her fasting sugars are doing better now medication change.  She is tolerating Lipitor well without muscle aches and pains.  She denies any prior history of strokes TIA seizures migraines or significant neurological problems.  There is no family history of stroke. Update 07/28/2020: She returns for follow-up after last visit 3 and half months ago.  She is accompanied by her daughter.  Patient complains of intermittent feeling of heart all over her head off and on for the last few days.  This lasted only few minutes.  On only 1 such occasion she had slight blurred vision.  She denies any accompanying headache, double vision, vertigo, gait or balance problems.  I had suggested patient undergo TEE at last visit but she refused.  She states her left-sided peripheral vision loss appears to have improved.  She had lab work on 07/02/2020 which showed LDL cholesterol to be optimal at 62 mg percent.  Patient remains on Lipitor 80 mg daily but does complain of muscle aches and pains.  She states her blood pressure is under good control and today it is 137/58.  Patient states her sugars are also doing better and there is no recent hemoglobin A1c.  She is also complaining of memory difficulties trouble remembering recent information which seem to have started since her stroke and is not improving. 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    Comment: full time  Tobacco Use   Smoking status: Never   Smokeless tobacco: Never  Vaping Use   Vaping Use: Never used  Substance and Sexual Activity   Alcohol use: Not Currently   Drug use: Never   Sexual activity: Not Currently    Birth control/protection: Surgical    Comment: tubal  Other Topics Concern   Not on file  Social History Narrative   Lives alone   Right Handed   Drinks caffeine 3xweek   Social Determinants of Health   Financial Resource Strain: Low Risk    Difficulty of Paying Living Expenses: Not hard at all  Food Insecurity: No Food Insecurity   Worried About Charity fundraiser in the Last Year: Never true   [place] in the Last Year: Never true  Transportation Needs: No Transportation Needs   Lack of Transportation (Medical): No   Lack of Transportation (Non-Medical): No  Physical Activity: Insufficiently Active   Days of Exercise per Week: 4 days   Minutes of Exercise per Session: 10 min  Stress: No Stress Concern Present   Feeling of Stress : Not at all  Social Connections: Moderately Isolated   Frequency of Communication with Friends and Family: More than three times a week   Frequency of Social Gatherings with Friends and Family: Once a week   Attends Religious Services: 1 to 4 times per year   Active Member of [employer] or Organizations: No   Attends Archivist Meetings: Never   Marital Status: Widowed  Human resources officer Violence: Not At Risk   Fear of Current or Ex-Partner: No   Emotionally Abused: No   Physically Abused: No   Sexually Abused: No    Medications:   Current Outpatient Medications on File Prior to Visit  Medication Sig Dispense Refill   amLODipine (NORVASC) 10 MG tablet Take 1 tablet (10 mg total) by mouth  daily. 90 tablet 3   aspirin EC 81 MG tablet Take 1 tablet (81 mg total) by mouth daily. Swallow whole. 30 tablet 11   atorvastatin (LIPITOR) 80 MG tablet Take 1 tablet (80 mg total) by mouth at bedtime. 90 tablet 1   blood glucose meter kit and supplies Dispense based on patient and insurance preference. Use up to four times daily as directed. (FOR ICD-10 E10.9, E11.9). 1 each 0   CALCIUM PO Take by mouth at bedtime.     dapagliflozin propanediol (FARXIGA) 10 MG TABS tablet Take 1 tablet (10 mg total) by mouth daily before breakfast. 90 tablet 1   glucose blood (CVS GLUCOSE METER TEST STRIPS) test strip Test glucose  4 times per day- One Touch 100 each 2   hydrALAZINE (APRESOLINE) 10 MG tablet TAKE 1 TABLET BY MOUTH THREE TIMES A DAY 90 tablet 1  Lancets (ONETOUCH ULTRASOFT) lancets Use as instructed, can use up to 4 times per day. 100 each 12   lisinopril (ZESTRIL) 40 MG tablet Take 1 tablet (40 mg total) by mouth daily. 90 tablet 3   metFORMIN (GLUCOPHAGE) 500 MG tablet Take 1 tablet PO daily for 1 week, then take 1 tablet PO BID 180 tablet 3   No current facility-administered medications on file prior to visit.    Allergies:  No Known Allergies  Physical Exam General: Mildly obese middle-aged African-American lady, seated, in no evident distress Head: head normocephalic and atraumatic.   Neck: supple with no carotid or supraclavicular bruits Cardiovascular: regular rate and rhythm, no murmurs Musculoskeletal: no deformity Skin:  no rash/petichiae Vascular:  Normal pulses all extremities  Neurologic Exam Mental Status: Awake and fully alert. Oriented to place and time. Recent and remote memory intact. Attention span, concentration and fund of knowledge appropriate. Mood and affect appropriate.  Diminished recall 2/3.  Able to name only 8 animals which can walk on 4 legs. Cranial Nerves: Fundoscopic exam reveals sharp disc margins. Pupils equal, briskly reactive to light. Extraocular  movements full without nystagmus. Visual fields show partial left homonymous hemianopsia to confrontation. Hearing intact. Facial sensation intact. Face, tongue, palate moves normally and symmetrically.  Motor: Normal bulk and tone. Normal strength in all tested extremity muscles. Sensory.: intact to touch , pinprick , position and vibratory sensation.  Coordination: Rapid alternating movements normal in all extremities. Finger-to-nose and heel-to-shin performed accurately bilaterally. Gait and Station: Arises from chair without difficulty. Stance is normal. Gait demonstrates normal stride length and balance . Able to heel, toe and tandem walk without difficulty.  Reflexes: 1+ and symmetric. Toes downgoing.   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  Vascular risk factors of diabetes, hypertension, hyperlipidemia and obesity     PLAN: I had a long d/w patient  and her daughter about her recent stroke, risk for recurrent stroke/TIAs, personally independently reviewed imaging studies and stroke evaluation results and answered questions.Continue aspirin 81 mg daily  for secondary stroke prevention and maintain strict control of hypertension with blood pressure goal below 130/90, diabetes with hemoglobin A1c goal below 6.5% and lipids with LDL cholesterol goal below 70 mg/dL. I also advised the patient to eat a healthy diet with plenty of whole grains, cereals, fruits and vegetables, exercise regularly and maintain ideal body weight I recommend she start taking coenzyme Q 10 200 mg daily to help with her statin myalgias..I advised the patient to increase participation in cognitively challenging activities like solving crossword puzzles, playing bridge and sodoku.  We also discussed memory compensation strategies.  She may also consider possible participation in the [place] stroke prevention  trial if interested and will be given information to review and decide.  Followup in the future with my nurse practitioner Janett Billow in 6 months or call earlier if necessary. Greater than 50% time during this 25-minute consultation visit was spent on counseling and coordination of care about stroke discussion about stroke prevention and treatment and answering questions. Antony Contras, MD Note: This document was prepared with digital dictation and possible smart phrase technology. Any transcriptional errors that result from this process are unintentional.

## 2020-07-29 ENCOUNTER — Telehealth: Payer: Self-pay | Admitting: *Deleted

## 2020-07-29 NOTE — Telephone Encounter (Signed)
Pt is scheduled 07/30/20 with Dr. Nelda Marseille. [place]

## 2020-07-30 ENCOUNTER — Encounter: Payer: Self-pay | Admitting: Obstetrics & Gynecology

## 2020-07-30 ENCOUNTER — Other Ambulatory Visit: Payer: Self-pay

## 2020-07-30 ENCOUNTER — Telehealth (INDEPENDENT_AMBULATORY_CARE_PROVIDER_SITE_OTHER): Payer: Federal, State, Local not specified - PPO | Admitting: Obstetrics & Gynecology

## 2020-07-30 VITALS — Ht 62.0 in | Wt 212.0 lb

## 2020-07-30 DIAGNOSIS — N8501 Benign endometrial hyperplasia: Secondary | ICD-10-CM

## 2020-07-30 MED ORDER — PROGESTERONE MICRONIZED 100 MG PO CAPS: 100.0000 mg | ORAL_CAPSULE | Freq: Every day | ORAL | 4 refills | Status: AC

## 2020-07-30 NOTE — Progress Notes (Signed)
TELEHEALTH GYNECOLOGY VISIT ENCOUNTER NOTE  Provider location: Center for [place] at [facility]   Patient location: Home  I connected with Martha Jensen on 07/30/20 at  3:10 PM EDT by telephone and verified that I am speaking with the correct person using two identifiers. Patient preferred phone call only.   I discussed the limitations, risks, security and privacy concerns of performing an evaluation and management service by telephone and the availability of in person appointments. I also discussed with the patient that there may be a patient responsible charge related to this service. The patient expressed understanding and agreed to proceed.   History:  Martha Jensen is a 60 y.o. [phone] female being seen for follow up regarding:  1) AUB/endometrial hyperplasia -Previously noted that for several mo she has had intermittent pink spotting.  Additionally, she reported that she was still having menses every few weeks lasting for about 4-5 day.  At her last visit on 6/24 EMB was completed, she presents today to review results.  EMB showed: simple hyperplasia without atypica   She denies any abnormal vaginal discharge, irritation, pelvic pain or other concerns.       Past Medical History:  Diagnosis Date   Diabetes mellitus without complication ([place])    Hyperlipidemia    Hypertension    Stroke [facility])    Has blurred vision   Past Surgical History:  Procedure Laterality Date   TUBAL LIGATION     The following portions of the patient's history were reviewed and updated as appropriate: allergies, current medications, past family history, past medical history, past social history, past surgical history and problem list.    Review of Systems:  Pertinent items noted in HPI and remainder of comprehensive ROS otherwise negative.  Physical Exam:   General:  Alert, oriented and cooperative.   Mental Status: Normal mood and affect perceived. Normal judgment and thought  content.  Physical exam deferred due to nature of the encounter  Labs and Imaging No results found for this or any previous visit (from the past 336 hour(s)). US PELVIS TRANSVAGINAL NON-OB (TV ONLY)  Result Date: 07/10/2020 Images from the original result were not included.  Center for [facility] @ [facility] [place] [address]                                                                                                                                   GYNECOLOGIC SONOGRAM Martha Jensen is a 60 y.o. [phone] No LMP recorded. (Menstrual status: Irregular Periods). She is here for a pelvic sonogram for postmenopausal bleeding. Uterus                      7.3 x 5.1 x 6.3 cm, Total uterine volume 123 cc, heterogeneous anteverted uterus with mult fibroids,(#1) posterior fundal subserosal fibroid 3.9 x 2.6 x 3.2 cm,(#2) fundal subserosal fibroid 2.2  x 2.1 x 1.9 cm,(#3) posterior LUS subserosal fibroid 2.2 x 2 x 2.1 cm Endometrium          9.4 mm, symmetrical, complex thickened endometrium with a color flow Right ovary             2.5 x 2 x 1.8 cm, wnl Left ovary                1.8 x 2.2 x 1.2 cm, wnl No free fluid Technician Comments: PELVIC US TA/TV: heterogeneous anteverted uterus with mult fibroids,(#1) posterior fundal subserosal fibroid 3.9 x 2.6 x 3.2 cm,(#2) fundal subserosal fibroid 2.2 x 2.1 x 1.9 cm,(#3) posterior LUS subserosal fibroid 2.2 x 2 x 2.1 cm,complex thickened endometrium with a color flow,EEC 9.4 mm,normal ovaries,ovaries appear mobile,no free fluid,no pain during ultrasound Chaperone [address] Heide Guile 07/02/2020 10:05 AM Clinical Impression and recommendations: I have reviewed the sonogram results above, combined with the patient's current clinical course, below are my impressions and any appropriate recommendations for management based on the sonographic findings. Uterus is normal size shape and contour, 3 fibroids present, none submucosal Endometrium is  thickened at 9.4 mm  and heterogenous, both abnormal findings in a post menopausal woman Both ovaries are small, consistent with menopause, with normal shape and morphology [name] 07/10/2020 6:55 PM   US PELVIS (TRANSABDOMINAL ONLY)  Result Date: 07/10/2020 Images from the original result were not included.  Center for [facility] @ [facility] [place] [address]                                                                                                                                   GYNECOLOGIC SONOGRAM Martha Jensen is a 60 y.o. [phone] No LMP recorded. (Menstrual status: Irregular Periods). She is here for a pelvic sonogram for postmenopausal bleeding. Uterus                      7.3 x 5.1 x 6.3 cm, Total uterine volume 123 cc, heterogeneous anteverted uterus with mult fibroids,(#1) posterior fundal subserosal fibroid 3.9 x 2.6 x 3.2 cm,(#2) fundal subserosal fibroid 2.2 x 2.1 x 1.9 cm,(#3) posterior LUS subserosal fibroid 2.2 x 2 x 2.1 cm Endometrium          9.4 mm, symmetrical, complex thickened endometrium with a color flow Right ovary             2.5 x 2 x 1.8 cm, wnl Left ovary                1.8 x 2.2 x 1.2 cm, wnl No free fluid Technician Comments: PELVIC US TA/TV: heterogeneous anteverted uterus with mult fibroids,(#1) posterior fundal subserosal fibroid 3.9 x 2.6 x 3.2 cm,(#2) fundal subserosal fibroid 2.2 x 2.1 x 1.9 cm,(#3) posterior LUS subserosal fibroid 2.2 x 2 x 2.1 cm,complex thickened endometrium with a color flow,EEC  9.4 mm,normal ovaries,ovaries appear mobile,no free fluid,no pain during ultrasound Chaperone [id number] Heide Guile 07/02/2020 10:05 AM Clinical Impression and recommendations: I have reviewed the sonogram results above, combined with the patient's current clinical course, below are my impressions and any appropriate recommendations for management based on the sonographic findings. Uterus is normal size shape and contour, 3 fibroids  present, none submucosal Endometrium is thickened at 9.4 mm  and heterogenous, both abnormal findings in a post menopausal woman Both ovaries are small, consistent with menopause, with normal shape and morphology [name] 07/10/2020 6:55 PM      Assessment and Plan:  1) Simple hyperplasia -reviewed biopsy results and discussed management options -due to AUB and 1% risk of endometrial cancer advised treatment with progeterone -Rx sent in with plan for 69mos follow up and biopsy -Questions and concerns were addressed     I provided 15 minutes of non-face-to-face time during this encounter.   Annalee Genta, [place] for [employer], [place]

## 2020-07-31 DIAGNOSIS — Z0279 Encounter for issue of other medical certificate: Secondary | ICD-10-CM

## 2020-08-05 NOTE — Telephone Encounter (Signed)
Completed and faxed.

## 2020-08-11 ENCOUNTER — Telehealth: Payer: Self-pay

## 2020-08-11 NOTE — Telephone Encounter (Signed)
Disability Continuation Forms (Fax to [employer])  Noted Copied Sleeved

## 2020-08-11 NOTE — Telephone Encounter (Signed)
Duplicate forms, forms were faxed 07.19.2022 at 3:14 pm and sent to scans.

## 2020-08-11 NOTE — Telephone Encounter (Signed)
Fax  # [phone]

## 2020-08-14 ENCOUNTER — Encounter: Payer: Self-pay | Admitting: Nurse Practitioner

## 2020-08-14 ENCOUNTER — Ambulatory Visit: Payer: Federal, State, Local not specified - PPO | Admitting: Nurse Practitioner

## 2020-08-14 ENCOUNTER — Other Ambulatory Visit: Payer: Self-pay

## 2020-08-14 VITALS — BP 137/93 | HR 103 | Temp 97.4°F | Ht 62.0 in | Wt 209.0 lb

## 2020-08-14 DIAGNOSIS — I639 Cerebral infarction, unspecified: Secondary | ICD-10-CM | POA: Diagnosis not present

## 2020-08-14 DIAGNOSIS — I10 Essential (primary) hypertension: Secondary | ICD-10-CM | POA: Diagnosis not present

## 2020-08-14 DIAGNOSIS — E1165 Type 2 diabetes mellitus with hyperglycemia: Secondary | ICD-10-CM

## 2020-08-14 MED ORDER — HYDRALAZINE HCL 25 MG PO TABS: 25.0000 mg | ORAL_TABLET | Freq: Three times a day (TID) | ORAL | 3 refills | Status: AC

## 2020-08-14 NOTE — Progress Notes (Signed)
Acute Office Visit  Subjective:    Patient ID: Martha Jensen, female    DOB: April 27, 1960, 60 y.o.   MRN: [id number]  Chief Complaint  Patient presents with   Hypertension    Follow up    HPI Patient is in today for BP follow-up. At her last OV, her BP was 152/97, so we increased her lisinopril to 40 mg  Past Medical History:  Diagnosis Date   Diabetes mellitus without complication ([place])    Hyperlipidemia    Hypertension    Stroke [facility])    Has blurred vision    Past Surgical History:  Procedure Laterality Date   TUBAL LIGATION      Family History  Problem Relation Age of Onset   Heart attack Brother     Social History   Socioeconomic History   Marital status: Divorced    Spouse name: Not on file   Number of children: Not on file   Years of education: Not on file   Highest education level: Not on file  Occupational History   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    Comment: full time  Tobacco Use   Smoking status: Never   Smokeless tobacco: Never  Vaping Use   Vaping Use: Never used  Substance and Sexual Activity   Alcohol use: Not Currently   Drug use: Never   Sexual activity: Not Currently    Birth control/protection: Surgical    Comment: tubal  Other Topics Concern   Not on file  Social History Narrative   Lives alone   Right Handed   Drinks caffeine 3xweek   Social Determinants of Health   Financial Resource Strain: Low Risk    Difficulty of Paying Living Expenses: Not hard at all  Food Insecurity: No Food Insecurity   Worried About Charity fundraiser in the Last Year: Never true   [place] in the Last Year: Never true  Transportation Needs: No Transportation Needs   Lack of Transportation (Medical): No   Lack of Transportation (Non-Medical): No  Physical Activity: Insufficiently Active   Days of Exercise per Week: 4 days   Minutes of Exercise per Session: 10 min  Stress: No Stress Concern Present   Feeling  of Stress : Not at all  Social Connections: Moderately Isolated   Frequency of Communication with Friends and Family: More than three times a week   Frequency of Social Gatherings with Friends and Family: Once a week   Attends Religious Services: 1 to 4 times per year   Active Member of [employer] or Organizations: No   Attends Archivist Meetings: Never   Marital Status: Widowed  Human resources officer Violence: Not At Risk   Fear of Current or Ex-Partner: No   Emotionally Abused: No   Physically Abused: No   Sexually Abused: No    Outpatient Medications Prior to Visit  Medication Sig Dispense Refill   amLODipine (NORVASC) 10 MG tablet Take 1 tablet (10 mg total) by mouth daily. 90 tablet 3   atorvastatin (LIPITOR) 80 MG tablet Take 1 tablet (80 mg total) by mouth at bedtime. 90 tablet 1   blood glucose meter kit and supplies Dispense based on patient and insurance preference. Use up to four times daily as directed. (FOR ICD-10 E10.9, E11.9). 1 each 0   CALCIUM PO Take by mouth at bedtime.     dapagliflozin propanediol (FARXIGA) 10 MG TABS tablet Take 1 tablet (10 mg total) by  mouth daily before breakfast. 90 tablet 1   glucose blood ([employer] GLUCOSE METER TEST STRIPS) test strip Test glucose  4 times per day- One Touch 100 each 2   Lancets (ONETOUCH ULTRASOFT) lancets Use as instructed, can use up to 4 times per day. 100 each 12   lisinopril (ZESTRIL) 40 MG tablet Take 1 tablet (40 mg total) by mouth daily. 90 tablet 3   metFORMIN (GLUCOPHAGE) 500 MG tablet Take 1 tablet PO daily for 1 week, then take 1 tablet PO BID 180 tablet 3   progesterone (PROMETRIUM) 100 MG capsule Take 1 capsule (100 mg total) by mouth daily. 90 capsule 4   aspirin EC 81 MG tablet Take 1 tablet (81 mg total) by mouth daily. Swallow whole. (Patient not taking: Reported on 08/14/2020) 30 tablet 11   hydrALAZINE (APRESOLINE) 10 MG tablet TAKE 1 TABLET BY MOUTH THREE TIMES A DAY 90 tablet 1   No facility-administered  medications prior to visit.    No Known Allergies  Review of Systems     Objective:    Physical Exam  BP (!) 137/93 (BP Location: Left Arm, Patient Position: Sitting, Cuff Size: Large)   Pulse (!) 103   Temp (!) 97.4 F (36.3 C) (Temporal)   Ht _0  (1.575 m)   Wt 209 lb (94.8 kg)   LMP  (LMP Unknown)   SpO2 98%   BMI 38.23 kg/m  Wt Readings from Last 3 Encounters:  08/14/20 209 lb (94.8 kg)  07/30/20 212 lb (96.2 kg)  07/28/20 212 lb 8 oz (96.4 kg)    Health Maintenance Due  Topic Date Due   HIV Screening  Never done   COLONOSCOPY (Pts 45-67yr Insurance coverage will need to be confirmed)  Never done   MAMMOGRAM  Never done    There are no preventive care reminders to display for this patient.   Lab Results  Component Value Date   TSH 1.010 04/08/2020   Lab Results  Component Value Date   WBC 9.3 07/02/2020   HGB 13.0 07/02/2020   HCT 41.9 07/02/2020   MCV 83 07/02/2020   PLT 312 07/02/2020   Lab Results  Component Value Date   NA 140 07/02/2020   K 4.1 07/02/2020   CO2 21 07/02/2020   GLUCOSE 103 (H) 07/02/2020   BUN 17 07/02/2020   CREATININE 0.77 07/02/2020   BILITOT 0.3 07/02/2020   ALKPHOS 65 07/02/2020   AST 17 07/02/2020   ALT 16 07/02/2020   PROT 7.1 07/02/2020   ALBUMIN 4.4 07/02/2020   CALCIUM 9.3 07/02/2020   EGFR 88 07/02/2020   Lab Results  Component Value Date   CHOL 144 07/02/2020   Lab Results  Component Value Date   HDL 69 07/02/2020   Lab Results  Component Value Date   LDLCALC 62 07/02/2020   Lab Results  Component Value Date   TRIG 63 07/02/2020   Lab Results  Component Value Date   CHOLHDL 2.5 04/08/2020   Lab Results  Component Value Date   HGBA1C 14.3 (H) 04/08/2020       Assessment & Plan:   Problem List Items Addressed This Visit       Cardiovascular and Mediastinum   Essential hypertension, benign - Primary    BP Readings from Last 3 Encounters:  08/14/20 (!) 137/93  07/28/20 137/88   07/15/20 (!) 152/97  -INCREASE hydralazine to 25 mg TID (From 10 TID)      Relevant Medications  hydrALAZINE (APRESOLINE) 25 MG tablet     Endocrine   Type II diabetes mellitus, uncontrolled ([place])    -has upcoming appt with Dr. 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    Dispense:  90 tablet    Refill:  [place], NP

## 2020-08-14 NOTE — Assessment & Plan Note (Signed)
-  has upcoming appt with Dr. Dorris Fetch (8/15)

## 2020-08-14 NOTE — Assessment & Plan Note (Addendum)
-  she is working through process to get permanent disability -will provide any medical records necessary to help her make her case -has visual field changes post-CVA and can't drive

## 2020-08-14 NOTE — Assessment & Plan Note (Signed)
BP Readings from Last 3 Encounters:  08/14/20 (!) 137/93  07/28/20 137/88  07/15/20 (!) 152/97   -INCREASE hydralazine to 25 mg TID (From 10 TID)

## 2020-08-18 ENCOUNTER — Telehealth: Payer: Self-pay

## 2020-08-18 NOTE — Telephone Encounter (Signed)
Voice mail box not set up.

## 2020-08-18 NOTE — Telephone Encounter (Signed)
Patient called requesting a extension on her work note ph# [phone]

## 2020-08-19 NOTE — Telephone Encounter (Signed)
Work note done and will place up front for pick up. Pt informed.

## 2020-08-19 NOTE — Telephone Encounter (Signed)
Pt says she is faxing [place] something today so she can get her benefits. Also. She needs a work note starting 08/18/20 until she sees you at the end of this month for her job while she is getting all of this taken care of. Please advise.

## 2020-08-19 NOTE — Telephone Encounter (Signed)
That is fine. She had a stroke and has changes to her visual fields and has been on short-term disability while working on her permanent disability paperwork. Write her out of work due to sequelae of her cerebrovascular accident.

## 2020-08-20 ENCOUNTER — Telehealth: Payer: Self-pay

## 2020-08-20 NOTE — Telephone Encounter (Signed)
Disability forms received via fax Copied  Noted Sleeved

## 2020-09-01 ENCOUNTER — Ambulatory Visit: Payer: Federal, State, Local not specified - PPO | Admitting: "Endocrinology

## 2020-09-01 ENCOUNTER — Encounter: Payer: Self-pay | Admitting: "Endocrinology

## 2020-09-01 ENCOUNTER — Other Ambulatory Visit: Payer: Self-pay

## 2020-09-01 VITALS — BP 138/90 | HR 92 | Ht 62.0 in | Wt 210.0 lb

## 2020-09-01 DIAGNOSIS — E782 Mixed hyperlipidemia: Secondary | ICD-10-CM | POA: Diagnosis not present

## 2020-09-01 DIAGNOSIS — I10 Essential (primary) hypertension: Secondary | ICD-10-CM

## 2020-09-01 DIAGNOSIS — E1165 Type 2 diabetes mellitus with hyperglycemia: Secondary | ICD-10-CM

## 2020-09-01 LAB — POCT GLYCOSYLATED HEMOGLOBIN (HGB A1C): HbA1c, POC (controlled diabetic range): 6.6 % (ref 0.0–7.0)

## 2020-09-01 NOTE — Patient Instructions (Signed)

## 2020-09-01 NOTE — Progress Notes (Signed)
09/01/2020, 12:14 PM  Endocrinology follow-up note   Subjective:    Patient ID: Martha Jensen, female    DOB: [birth year].  CHLORIS MARCOUX is being seen in follow-up after he was seen in consultation for management of currently uncontrolled symptomatic diabetes requested by  Noreene Larsson, NP.   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  Tobacco Use   Smoking status: Never   Smokeless tobacco: Never  Vaping Use   Vaping Use: Never used  Substance and Sexual Activity   Alcohol use: Not Currently   Drug use: Never   Sexual activity: Not Currently    Birth control/protection: Surgical    Comment: tubal  Other Topics Concern   Not on file  Social History Narrative   Lives alone   Right Handed   Drinks caffeine 3xweek   Social Determinants of Health   Financial Resource Strain: Low Risk    Difficulty of Paying Living Expenses: Not hard at all  Food Insecurity: No Food Insecurity   Worried About Charity fundraiser in the Last Year: Never true   [place] in the Last Year: Never true  Transportation Needs: No Transportation Needs   Lack of Transportation (Medical): No   Lack of Transportation (Non-Medical): No  Physical Activity: Insufficiently Active   Days of Exercise per Week: 4 days   Minutes of Exercise per Session: 10 min  Stress: No Stress Concern Present   Feeling of Stress :  Not at all  Social Connections: Moderately Isolated   Frequency of Communication with Friends and Family: More than three times a week   Frequency of Social Gatherings with Friends and Family: Once a week   Attends Religious Services: 1 to 4 times per year   Active Member of [employer] or Organizations: No   Attends Archivist Meetings: Never   Marital Status: Widowed    Family History  Problem Relation Age of Onset   Heart attack Brother     Outpatient Encounter Medications as of 09/01/2020  Medication Sig   apixaban (ELIQUIS) 5 MG TABS tablet Take 5 mg by mouth 2 (two) times daily.   dapagliflozin propanediol (FARXIGA) 5 MG TABS tablet Take 5 mg by mouth daily.   amLODipine (NORVASC) 10 MG tablet Take 1 tablet (10 mg total) by mouth daily.   Aspirin 81 MG CAPS Take 1 capsule by mouth daily.   atorvastatin (LIPITOR) 80 MG tablet Take 1 tablet (80 mg total) by mouth at bedtime.   blood glucose meter kit and supplies  Dispense based on patient and insurance preference. Use up to four times daily as directed. (FOR ICD-10 E10.9, E11.9).   CALCIUM PO Take by mouth at bedtime.   glucose blood ([employer] GLUCOSE METER TEST STRIPS) test strip Test glucose  4 times per day- One Touch   hydrALAZINE (APRESOLINE) 25 MG tablet Take 1 tablet (25 mg total) by mouth 3 (three) times daily.   Lancets (ONETOUCH ULTRASOFT) lancets Use as instructed, can use up to 4 times per day.   lisinopril (ZESTRIL) 40 MG tablet Take 1 tablet (40 mg total) by mouth daily.   metFORMIN (GLUCOPHAGE) 500 MG tablet Take 1 tablet PO daily for 1 week, then take 1 tablet PO BID   progesterone (PROMETRIUM) 100 MG capsule Take 1 capsule (100 mg total) by mouth daily.   [DISCONTINUED] dapagliflozin propanediol (FARXIGA) 10 MG TABS tablet Take 1 tablet (10 mg total) by mouth daily before breakfast.   No facility-administered encounter medications on file as of 09/01/2020.    ALLERGIES: No Known Allergies  VACCINATION  STATUS: Immunization History  Administered Date(s) Administered   PFIZER(Purple Top)SARS-COV-2 Vaccination 10/10/2019, 10/31/2019   Pneumococcal Polysaccharide-23 04/04/2020    Diabetes She presents for her follow-up diabetic visit. She has type 2 diabetes mellitus. Onset time: Patient was diagnosed at approximate age of 30 years. Her disease course has been improving. There are no hypoglycemic associated symptoms. Pertinent negatives for hypoglycemia include no confusion, headaches, pallor or seizures. Associated symptoms include blurred vision and fatigue. Pertinent negatives for diabetes include no chest pain, no polydipsia, no polyphagia and no polyuria. Symptoms are improving. Diabetic complications include a CVA. Risk factors for coronary artery disease include diabetes mellitus, dyslipidemia, hypertension, obesity, family history, sedentary lifestyle and post-menopausal. Current diabetic treatments: She is currently on Metformin 500 mg p.o. twice daily, Farxiga 10 mg p.o. daily. Her weight is stable. She is following a generally unhealthy diet. When asked about meal planning, she reported none. She has not had a previous visit with a dietitian. She rarely participates in exercise. Her home blood glucose trend is decreasing steadily. Her breakfast blood glucose range is generally 130-140 mg/dl. Her overall blood glucose range is 130-140 mg/dl. (She presents with controlled glycemic profile to target, point-of-care A1c of 6.6% dramatically improving from 14.3%.  He did not have any hypoglycemia.   At diagnosis in February 2022 her A1c was >16%.) An ACE inhibitor/angiotensin II receptor blocker is being taken. Eye exam is current.    Review of Systems  Constitutional:  Positive for fatigue. Negative for chills, fever and unexpected weight change.  HENT:  Negative for trouble swallowing and voice change.   Eyes:  Positive for blurred vision. Negative for visual disturbance.  Respiratory:  Negative  for cough, shortness of breath and wheezing.   Cardiovascular:  Negative for chest pain, palpitations and leg swelling.  Gastrointestinal:  Negative for diarrhea, nausea and vomiting.  Endocrine: Negative for cold intolerance, heat intolerance, polydipsia, polyphagia and polyuria.  Musculoskeletal:  Negative for arthralgias and myalgias.  Skin:  Negative for color change, pallor, rash and wound.  Neurological:  Negative for seizures and headaches.  Psychiatric/Behavioral:  Negative for confusion and suicidal ideas.    Objective:    Vitals with BMI 09/01/2020 08/14/2020 07/30/2020  Height _0  _1  _2   Weight 210 lbs 209 lbs 212 lbs  BMI 38.4 37.48 27.07  Systolic 867 544 -  Diastolic 90 93 -  Pulse 92 103 -    BP 138/90  Pulse 92   Ht _0  (1.575 m)   Wt 210 lb (95.3 kg)   LMP  (LMP Unknown)   BMI 38.41 kg/m   Wt Readings from Last 3 Encounters:  09/01/20 210 lb (95.3 kg)  08/14/20 209 lb (94.8 kg)  07/30/20 212 lb (96.2 kg)     Physical Exam Constitutional:      Appearance: She is well-developed.  HENT:     Head: Normocephalic and atraumatic.  Neck:     Thyroid: No thyromegaly.     Trachea: No tracheal deviation.  Cardiovascular:     Rate and Rhythm: Normal rate and regular rhythm.  Pulmonary:     Effort: Pulmonary effort is normal.  Abdominal:     Tenderness: There is no abdominal tenderness. There is no guarding.  Musculoskeletal:        General: Normal range of motion.     Cervical back: Normal range of motion and neck supple.  Skin:    General: Skin is warm and dry.     Coloration: Skin is not pale.     Findings: No erythema or rash.  Neurological:     General: No focal deficit present.     Mental Status: She is alert and oriented to person, place, and time.     Cranial Nerves: No cranial nerve deficit.     Coordination: Coordination normal.     Deep Tendon Reflexes: Reflexes are normal and symmetric.     Comments: Monofilament test is normal on  bilateral lower extremities.  Psychiatric:        Judgment: Judgment normal.      CMP ( most recent) CMP     Component Value Date/Time   NA 140 07/02/2020 0858   K 4.1 07/02/2020 0858   CL 105 07/02/2020 0858   CO2 21 07/02/2020 0858   GLUCOSE 103 (H) 07/02/2020 0858   BUN 17 07/02/2020 0858   CREATININE 0.77 07/02/2020 0858   CALCIUM 9.3 07/02/2020 0858   PROT 7.1 07/02/2020 0858   ALBUMIN 4.4 07/02/2020 0858   AST 17 07/02/2020 0858   ALT 16 07/02/2020 0858   ALKPHOS 65 07/02/2020 0858   BILITOT 0.3 07/02/2020 0858     Diabetic Labs (most recent): Lab Results  Component Value Date   HGBA1C 6.6 09/01/2020   HGBA1C 14.3 (H) 04/08/2020    Lipid Panel     Component Value Date/Time   CHOL 144 07/02/2020 0858   TRIG 63 07/02/2020 0858   HDL 69 07/02/2020 0858   CHOLHDL 2.5 04/08/2020 1407   LDLCALC 62 07/02/2020 0858   LABVLDL 13 07/02/2020 0858      Lab Results  Component Value Date   TSH 1.010 04/08/2020      Assessment & Plan:   1. Uncontrolled type 2 diabetes mellitus with hyperglycemia (HCC)  - RENESMAY NESBITT has currently uncontrolled symptomatic type 2 DM since  60 years of age.  She presents with controlled glycemic profile to target, point-of-care A1c of 6.6% dramatically improving from 14.3%.  He did not have any hypoglycemia.   At diagnosis in February 2022 her A1c was >16%.   Recent labs reviewed. - I had a long discussion with her about the progressive nature of diabetes and the pathology behind its complications. -her diabetes is complicated by CVA, obesity/sedentary life and she remains at a high risk for more acute and chronic complications which include CAD, CVA, CKD, retinopathy, and neuropathy. These are all discussed in detail with her.  -  I have counseled her on diet  and weight management  by adopting a carbohydrate restricted/protein rich diet. Patient is encouraged to switch to  unprocessed or minimally processed     complex starch  and increased protein intake (animal or plant source), fruits, and vegetables. -  she is advised to stick to a routine mealtimes to eat 3 meals  a day and avoid unnecessary snacks ( to snack only to correct hypoglycemia).   - she acknowledges that there is a room for improvement in her food and drink choices. - Suggestion is made for her to avoid simple carbohydrates  from her diet including Cakes, Sweet Desserts, Ice Cream, Soda (diet and regular), Sweet Tea, Candies, Chips, Cookies, Store Bought Juices, Alcohol in Excess of  1-2 drinks a day, Artificial Sweeteners,  Coffee Creamer, and "Sugar-free" Products, Lemonade. This will help patient to have more stable blood glucose profile and potentially avoid unintended weight gain.  - she will be scheduled with Jearld Fenton, RDN, CDE for diabetes education.  - I have approached her with the following individualized plan to manage  her diabetes and patient agrees:   -Based on her presentation with near target glycemic profile, she will not need insulin treatment for now.   - she is encouraged to call clinic for blood glucose levels less than 70 or above 200 mg /dl. -In the meantime, she is advised to continue metformin 500 mg p.o. twice daily, advised to lower Farxiga to 5 mg p.o. daily at breakfast.   - She is informed of side effects from these medications. - she will be considered for incretin therapy as appropriate next visit.  - Specific targets for  A1c;  LDL, HDL,  and Triglycerides were discussed with the patient.  2) Blood Pressure /Hypertension:  -Her blood pressure is controlled to target.   she is advised to continue her current medications including lisinopril 20 mg p.o. daily with breakfast . 3) Lipids/Hyperlipidemia:   Review of her recent lipid panel showed  controlled  LDL at 61 .  she  is advised to continue Lipitor 80 mg p.o. daily at bedtime.     Side effects and precautions discussed with her.  4)  Weight/Diet:  Body mass  index is 38.41 kg/m.  -   clearly complicating her diabetes care.   she is  a candidate for weight loss. I discussed with her the fact that loss of 5 - 10% of her  current body weight will have the most impact on her diabetes management.  Exercise, and detailed carbohydrates information provided  -  detailed on discharge instructions.  5) Chronic Care/Health Maintenance:  -she  is on ACEI/ARB and Statin medications and  is encouraged to initiate and continue to follow up with Ophthalmology, Dentist,  Podiatrist at least yearly or according to recommendations, and advised to   stay away from smoking. I have recommended yearly flu vaccine and pneumonia vaccine at least every 5 years; moderate intensity exercise for up to 150 minutes weekly; and  sleep for at least 7 hours a day.  POCT ABI Results 09/01/20  Her screening ABI was normal today April 24, 2020.  Her neck study is in April 2027, or sooner if needed. Right ABI: 1.18      left ABI: 1.21  Right leg systolic / diastolic: 035/465 mmHg Left leg systolic / diastolic: 681/275 mmHg  Arm systolic / diastolic: 170/017 mmHG   - she is  advised to maintain close follow up with  Noreene Larsson, NP for primary care needs, as well as her other providers for optimal and coordinated care.   I spent 32 minutes in the care of the patient today including review of labs from [facility], Lipids, Thyroid Function, Hematology (current and previous including abstractions from other facilities); face-to-face time discussing  her blood glucose readings/logs, discussing hypoglycemia and hyperglycemia episodes and symptoms, medications doses, her options of short and long term treatment based on the latest standards of care / guidelines;  discussion about incorporating lifestyle medicine;  and documenting the encounter.    Please refer to Patient Instructions for Blood Glucose Monitoring and Insulin/Medications Dosing Guide"  in media tab for additional information. Please   also refer to " Patient Self Inventory" in the Media  tab for reviewed elements of pertinent patient history.  Merian Capron participated in the discussions, expressed understanding, and voiced agreement with the above plans.  All questions were answered to her satisfaction. she is encouraged to contact clinic should she have any questions or concerns prior to her return visit.     Follow up plan: - Return in about 6 months (around 03/04/2021) for Bring Meter and Logs- A1c in Office.  Glade Lloyd, MD [facility] [address] [place], [place] [address] Phone: [phone]  Fax: [phone]    09/01/2020, 12:14 PM  This note was partially dictated with voice recognition software. Similar sounding words can be transcribed inadequately or may not  be corrected upon review.

## 2020-09-02 DIAGNOSIS — Z0279 Encounter for issue of other medical certificate: Secondary | ICD-10-CM

## 2020-09-03 NOTE — Telephone Encounter (Signed)
Complete

## 2020-09-05 ENCOUNTER — Ambulatory Visit: Payer: Federal, State, Local not specified - PPO | Admitting: Registered"

## 2020-09-08 ENCOUNTER — Telehealth: Payer: Self-pay

## 2020-09-08 NOTE — Telephone Encounter (Signed)
Faxed

## 2020-09-08 NOTE — Telephone Encounter (Signed)
Please fax a copy of August disability form to [phone] - [employer]

## 2020-09-15 ENCOUNTER — Other Ambulatory Visit: Payer: Self-pay

## 2020-09-15 ENCOUNTER — Encounter: Payer: Self-pay | Admitting: Nurse Practitioner

## 2020-09-15 ENCOUNTER — Ambulatory Visit: Payer: Federal, State, Local not specified - PPO | Admitting: Nurse Practitioner

## 2020-09-15 VITALS — BP 137/86 | HR 83 | Temp 98.5°F | Ht 62.0 in | Wt 209.0 lb

## 2020-09-15 DIAGNOSIS — E1165 Type 2 diabetes mellitus with hyperglycemia: Secondary | ICD-10-CM

## 2020-09-15 DIAGNOSIS — I10 Essential (primary) hypertension: Secondary | ICD-10-CM

## 2020-09-15 DIAGNOSIS — E782 Mixed hyperlipidemia: Secondary | ICD-10-CM | POA: Diagnosis not present

## 2020-09-15 NOTE — Assessment & Plan Note (Signed)
BP Readings from Last 3 Encounters:  09/15/20 137/86  09/01/20 138/90  08/14/20 (!) 137/93   -well controlled; continue hydralazine

## 2020-09-15 NOTE — Patient Instructions (Signed)
Please have fasting labs drawn 2-3 days prior to your appointment so we can discuss the results during your office visit.  

## 2020-09-15 NOTE — Progress Notes (Signed)
Acute Office Visit  Subjective:    Patient ID: Martha Jensen, female    DOB: 1960/01/25, 60 y.o.   MRN: [id number]  Chief Complaint  Patient presents with   Hypertension    Follow up    Hypertension  Patient is in today for BP check. At her last OV, we started her on hydralazine in addition to the amlodipine and lisinopril that she was already taking.  She was place don a clinical trial with ASA and either placebo or apixaban by her neurologist.  She states that she was cleared to drive.    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    Comment: full time  Tobacco Use   Smoking status: Never   Smokeless tobacco: Never  Vaping Use   Vaping Use: Never used  Substance and Sexual Activity   Alcohol use: Not Currently   Drug use: Never   Sexual activity: Not Currently    Birth control/protection: Surgical    Comment: tubal  Other Topics Concern   Not on file  Social History Narrative   Lives alone   Right Handed   Drinks caffeine 3xweek   Social Determinants of Health   Financial Resource Strain: Low Risk    Difficulty of Paying Living Expenses: Not hard at all  Food Insecurity: No Food Insecurity   Worried About Charity fundraiser in the Last Year: Never true   [place] in the Last Year: Never true  Transportation Needs: No Transportation Needs   Lack of Transportation (Medical): No   Lack of  Transportation (Non-Medical): No  Physical Activity: Insufficiently Active   Days of Exercise per Week: 4 days   Minutes of Exercise per Session: 10 min  Stress: No Stress Concern Present   Feeling of Stress : Not at all  Social Connections: Moderately Isolated   Frequency of Communication with Friends and Family: More than three times a week   Frequency of Social Gatherings with Friends and Family: Once a week   Attends Religious Services: 1 to 4 times per year   Active Member of [employer] or Organizations: No   Attends Archivist Meetings: Never   Marital Status: Widowed  Human resources officer Violence: Not At Risk   Fear of Current or Ex-Partner: No   Emotionally Abused: No   Physically Abused: No   Sexually Abused: No    Outpatient Medications Prior to Visit  Medication Sig Dispense Refill   amLODipine (NORVASC) 10 MG tablet Take 1 tablet (10 mg total) by mouth daily. 90 tablet 3   apixaban (ELIQUIS) 5 MG TABS tablet Take 5 mg by mouth 2 (two) times daily.     Aspirin 81 MG CAPS Take 1 capsule by mouth daily.     atorvastatin (LIPITOR) 80 MG tablet Take 1  tablet (80 mg total) by mouth at bedtime. 90 tablet 1   blood glucose meter kit and supplies Dispense based on patient and insurance preference. Use up to four times daily as directed. (FOR ICD-10 E10.9, E11.9). 1 each 0   CALCIUM PO Take by mouth at bedtime.     dapagliflozin propanediol (FARXIGA) 5 MG TABS tablet Take 5 mg by mouth daily.     glucose blood (CVS GLUCOSE METER TEST STRIPS) test strip Test glucose  4 times per day- One Touch 100 each 2   hydrALAZINE (APRESOLINE) 25 MG tablet Take 1 tablet (25 mg total) by mouth 3 (three) times daily. 90 tablet 3   Lancets (ONETOUCH ULTRASOFT) lancets Use as instructed, can use up to 4 times per day. 100 each 12   lisinopril (ZESTRIL) 40 MG tablet Take 1 tablet (40 mg total) by mouth daily. 90 tablet 3   metFORMIN (GLUCOPHAGE) 500 MG tablet Take 1 tablet PO daily for 1 week, then  take 1 tablet PO BID 180 tablet 3   progesterone (PROMETRIUM) 100 MG capsule Take 1 capsule (100 mg total) by mouth daily. 90 capsule 4   No facility-administered medications prior to visit.    No Known Allergies  Review of Systems  Constitutional: Negative.   Eyes:        Still has deficits in her visual fields, but was cleared to drive by neurology  Respiratory: Negative.    Cardiovascular: Negative.   Psychiatric/Behavioral: Negative.        Objective:    Physical Exam Constitutional:      Appearance: Normal appearance.  Cardiovascular:     Rate and Rhythm: Normal rate and regular rhythm.     Pulses: Normal pulses.     Heart sounds: Normal heart sounds.  Pulmonary:     Effort: Pulmonary effort is normal.     Breath sounds: Normal breath sounds.  Neurological:     Mental Status: She is alert.  Psychiatric:        Mood and Affect: Mood normal.        Behavior: Behavior normal.        Thought Content: Thought content normal.        Judgment: Judgment normal.    BP 137/86 (BP Location: Left Arm, Patient Position: Sitting, Cuff Size: Large)   Pulse 83   Temp 98.5 F (36.9 C) (Oral)   Ht _0  (1.575 m)   Wt 209 lb (94.8 kg)   SpO2 97%   BMI 38.23 kg/m  Wt Readings from Last 3 Encounters:  09/15/20 209 lb (94.8 kg)  09/01/20 210 lb (95.3 kg)  08/14/20 209 lb (94.8 kg)    Health Maintenance Due  Topic Date Due   HIV Screening  Never done   COLONOSCOPY (Pts 45-89yr Insurance coverage will need to be confirmed)  Never done   MAMMOGRAM  Never done   INFLUENZA VACCINE  08/18/2020    There are no preventive care reminders to display for this patient.   Lab Results  Component Value Date   TSH 1.010 04/08/2020   Lab Results  Component Value Date   WBC 9.3 07/02/2020   HGB 13.0 07/02/2020   HCT 41.9 07/02/2020   MCV 83 07/02/2020   PLT 312 07/02/2020   Lab Results  Component Value Date   NA 140 07/02/2020   K 4.1 07/02/2020   CO2 21 07/02/2020    GLUCOSE 103 (H) 07/02/2020   BUN 17 07/02/2020   CREATININE  0.77 07/02/2020   BILITOT 0.3 07/02/2020   ALKPHOS 65 07/02/2020   AST 17 07/02/2020   ALT 16 07/02/2020   PROT 7.1 07/02/2020   ALBUMIN 4.4 07/02/2020   CALCIUM 9.3 07/02/2020   EGFR 88 07/02/2020   Lab Results  Component Value Date   CHOL 144 07/02/2020   Lab Results  Component Value Date   HDL 69 07/02/2020   Lab Results  Component Value Date   LDLCALC 62 07/02/2020   Lab Results  Component Value Date   TRIG 63 07/02/2020   Lab Results  Component Value Date   CHOLHDL 2.5 04/08/2020   Lab Results  Component Value Date   HGBA1C 6.6 09/01/2020       Assessment & Plan:   Problem List Items Addressed This Visit       Cardiovascular and Mediastinum   Essential hypertension, benign - Primary    BP Readings from Last 3 Encounters:  09/15/20 137/86  09/01/20 138/90  08/14/20 (!) 137/93  -well controlled; continue hydralazine      Relevant Orders   CBC with Differential/Platelet   CMP14+EGFR   Lipid Panel With LDL/HDL Ratio     Endocrine   Type II diabetes mellitus, uncontrolled ([place])   Relevant Orders   CMP14+EGFR   Lipid Panel With LDL/HDL Ratio     Other   Mixed hyperlipidemia   Relevant Orders   CMP14+EGFR   Lipid Panel With LDL/HDL Ratio     No orders of the defined types were placed in this encounter.    Noreene Larsson, NP

## 2020-09-26 ENCOUNTER — Telehealth: Payer: Self-pay | Admitting: Nurse Practitioner

## 2020-09-26 NOTE — Telephone Encounter (Signed)
[employer]  Copied Noted Sleeved

## 2020-10-02 DIAGNOSIS — Z0279 Encounter for issue of other medical certificate: Secondary | ICD-10-CM

## 2020-10-07 NOTE — Telephone Encounter (Signed)
Complete  Patient aware

## 2020-10-13 ENCOUNTER — Other Ambulatory Visit: Payer: Self-pay | Admitting: Nurse Practitioner

## 2020-10-13 DIAGNOSIS — E1165 Type 2 diabetes mellitus with hyperglycemia: Secondary | ICD-10-CM

## 2020-10-20 ENCOUNTER — Encounter: Payer: Federal, State, Local not specified - PPO | Attending: "Endocrinology | Admitting: Registered"

## 2020-10-20 ENCOUNTER — Other Ambulatory Visit: Payer: Self-pay | Admitting: "Endocrinology

## 2020-11-05 ENCOUNTER — Encounter: Payer: Self-pay | Admitting: Nurse Practitioner

## 2020-11-06 NOTE — Telephone Encounter (Signed)
Spoke with Patient, this is the same episode. I advised that Martha Jensen was out of the office and if she could provide new FMLA paperwork we could do a virtual visit to complete new FMLA forms. Pt states that she will get new FMLA papers and call us back to schedule.

## 2020-11-07 ENCOUNTER — Telehealth: Payer: Self-pay

## 2020-11-07 NOTE — Telephone Encounter (Signed)
STD forms - August  FMLA   Copied Sleeved Noted

## 2020-11-12 ENCOUNTER — Ambulatory Visit: Payer: Federal, State, Local not specified - PPO | Admitting: Internal Medicine

## 2020-11-12 ENCOUNTER — Encounter: Payer: Self-pay | Admitting: Internal Medicine

## 2020-11-12 ENCOUNTER — Other Ambulatory Visit: Payer: Self-pay

## 2020-11-12 DIAGNOSIS — I6930 Unspecified sequelae of cerebral infarction: Secondary | ICD-10-CM | POA: Diagnosis not present

## 2020-11-12 NOTE — Progress Notes (Signed)
Virtual Visit via Telephone Note   This visit type was conducted due to national recommendations for restrictions regarding the COVID-19 Pandemic (e.g. social distancing) in an effort to limit this patient's exposure and mitigate transmission in our community.  Due to her co-morbid illnesses, this patient is at least at moderate risk for complications without adequate follow up.  This format is felt to be most appropriate for this patient at this time.  The patient did not have access to video technology/had technical difficulties with video requiring transitioning to audio format only (telephone).  All issues noted in this document were discussed and addressed.  No physical exam could be performed with this format.  Please refer to the patient's chart for her  consent to telehealth for [facility].   Evaluation Performed:  Follow-up visit  Date:  11/12/2020   ID:  Martha Jensen, DOB 10/10/1960, MRN [id number]  Patient Location: Home Provider Location: Office/Clinic  Location of Patient: Home Location of Provider: Telehealth Consent was obtain for visit to be over via telehealth. I verified that I am speaking with the correct person using two identifiers.  PCP:  Noreene Larsson, NP   Chief Complaint:  FMLA paperwork/vision problems  History of Present Illness:    Martha Jensen is a 60 y.o. female who has a televisit for FMLA paperwork for her work.  She had CVA in 02/2020 and had a residual vision deficits since then and could not drive due to it.  She has seen neurology, cardiology and ophthalmology for her stroke follow-up.  She continues to have vision problems; chronic, and intermittent headaches and neuropathic pain since she had stroke.  She has had short-term disability paperwork filled by Donneta Romberg, NP who is her PCP.  The patient does not have symptoms concerning for COVID-19 infection (fever, chills, cough, or new shortness of breath).   Past Medical, Surgical,  Social History, Allergies, and Medications have been Reviewed.  Past Medical History:  Diagnosis Date   Diabetes mellitus without complication ([place])    Hyperlipidemia    Hypertension    Stroke [facility])    Has blurred vision   Past Surgical History:  Procedure Laterality Date   TUBAL LIGATION       Current Meds  Medication Sig   amLODipine (NORVASC) 10 MG tablet Take 1 tablet (10 mg total) by mouth daily.   apixaban (ELIQUIS) 5 MG TABS tablet Take 5 mg by mouth 2 (two) times daily.   Aspirin 81 MG CAPS Take 1 capsule by mouth daily.   atorvastatin (LIPITOR) 80 MG tablet TAKE 1 TABLET BY MOUTH EVERYDAY AT BEDTIME   blood glucose meter kit and supplies Dispense based on patient and insurance preference. Use up to four times daily as directed. (FOR ICD-10 E10.9, E11.9).   CALCIUM PO Take by mouth at bedtime.   dapagliflozin propanediol (FARXIGA) 5 MG TABS tablet Take 5 mg by mouth daily.   glucose blood ([employer]S GLUCOSE METER TEST STRIPS) test strip Test glucose  4 times per day- One Touch   hydrALAZINE (APRESOLINE) 25 MG tablet Take 1 tablet (25 mg total) by mouth 3 (three) times daily.   Lancets (ONETOUCH ULTRASOFT) lancets Use as instructed, can use up to 4 times per day.   lisinopril (ZESTRIL) 40 MG tablet Take 1 tablet (40 mg total) by mouth daily.   metFORMIN (GLUCOPHAGE) 500 MG tablet Take 1 tablet PO daily for 1 week, then take 1 tablet PO BID  Allergies:   Patient has no known allergies.   ROS:   Please see the history of present illness.     All other systems reviewed and are negative.   Labs/Other Tests and Data Reviewed:    Recent Labs: 04/08/2020: TSH 1.010 07/02/2020: ALT 16; BUN 17; Creatinine, Ser 0.77; Hemoglobin 13.0; Platelets 312; Potassium 4.1; Sodium 140   Recent Lipid Panel Lab Results  Component Value Date/Time   CHOL 144 07/02/2020 08:58 AM   TRIG 63 07/02/2020 08:58 AM   HDL 69 07/02/2020 08:58 AM   CHOLHDL 2.5 04/08/2020 02:07 PM   LDLCALC 62  07/02/2020 08:58 AM    Wt Readings from Last 3 Encounters:  09/15/20 209 lb (94.8 kg)  09/01/20 210 lb (95.3 kg)  08/14/20 209 lb (94.8 kg)     ASSESSMENT & PLAN:    History of CVA with residual deficits Has vision deficit and neuropathy pain Followed by neurology Short-term disability form updated Advised to contact neurology office for FMLA paperwork Continue aspirin, statin and Eliquis  Time:   Today, I have spent 16 minutes reviewing the chart, including problem list, medications, and with the patient with telehealth technology discussing the above problems.   Medication Adjustments/Labs and Tests Ordered: Current medicines are reviewed at length with the patient today.  Concerns regarding medicines are outlined above.   Tests Ordered: No orders of the defined types were placed in this encounter.   Medication Changes: No orders of the defined types were placed in this encounter.    Note: This dictation was prepared with Dragon dictation along with smaller phrase technology. Similar sounding words can be transcribed inadequately or may not be corrected upon review. Any transcriptional errors that result from this process are unintentional.      Disposition:  Follow up  Signed, Lindell Spar, MD  11/12/2020 5:38 PM     [place]

## 2020-11-12 NOTE — Patient Instructions (Signed)
Please check with Neurology office for FMLA form till 12/2020.

## 2020-11-13 ENCOUNTER — Telehealth: Payer: Self-pay

## 2020-11-13 NOTE — Telephone Encounter (Signed)
error 

## 2020-11-14 ENCOUNTER — Encounter: Payer: Self-pay | Admitting: Internal Medicine

## 2020-11-14 NOTE — Telephone Encounter (Signed)
Patient called ask to fax forms to Dr Darlyn Chamber [phone].

## 2020-11-14 NOTE — Telephone Encounter (Signed)
Called patient, she will pick up FMLA forms.

## 2020-11-14 NOTE — Telephone Encounter (Signed)
Patient pick-up forms.

## 2020-11-19 DIAGNOSIS — Z0279 Encounter for issue of other medical certificate: Secondary | ICD-10-CM

## 2020-11-19 NOTE — Telephone Encounter (Signed)
Faxed and mailed to pt per request

## 2020-11-19 NOTE — Telephone Encounter (Signed)
Completed  and pt called to advise

## 2020-11-27 ENCOUNTER — Telehealth: Payer: Self-pay

## 2020-11-27 NOTE — Telephone Encounter (Signed)
Disability Forms  (needs dates updated, according to patient should be out of work form July 2022 and continued, patient still out of work)  Copied Noted Sleeved

## 2020-12-10 DIAGNOSIS — Z0279 Encounter for issue of other medical certificate: Secondary | ICD-10-CM

## 2020-12-15 NOTE — Telephone Encounter (Signed)
Called patient and will pick up forms.

## 2020-12-18 ENCOUNTER — Ambulatory Visit: Payer: Federal, State, Local not specified - PPO | Admitting: Nurse Practitioner

## 2020-12-24 ENCOUNTER — Ambulatory Visit: Payer: Federal, State, Local not specified - PPO | Admitting: Adult Health

## 2020-12-24 ENCOUNTER — Encounter: Payer: Self-pay | Admitting: Adult Health

## 2020-12-24 NOTE — Progress Notes (Deleted)
[facility] [address] [place]. [place] [address] [phone]       OFFICEFOLLOW UP VISIT NOTE  [name] Date of Birth:  [birth year] Medical Record Number:  [id number]   Referring MD: Demetrius Revel, NP  Reason for Referral: Stroke HPI: Initial visit 04/08/2020 Martha Jensen is a 60 year old African-American lady seen today for initial office consultation visit for stroke.  History is obtained from the patient and review of electronic medical records in [place].  I reviewed pertinent imaging results the actual films were not available for review today.  She has past medical history for diabetes, hypertension, hyperlipidemia who was admitted to [facility] on 03/12/2020 with sudden onset of visual disturbance which she described as disco lights type of vision with her body summitted restlessly being on fire from the armpits to the bottom of the toes.  These episodes lasted a few minutes to a maximum of 10 minutes and occurred several times.  She denied any accompanying headache slurred speech extremity weakness numbness.  She had an MRI scan of the brain done which showed right occipital cortical infarct.  CT angiogram of the brain and neck were done which showed no significant large vessel intracranial extracranial stenosis.  2D echo showed ejection fraction of 65 to 70% without cardiac source of embolism.  LDL cholesterol is elevated 169 mg percent.  Hemoglobin A1c report could not be obtained by me but the patient told me it was elevated at 16.  She was started on aspirin 325 mg daily as well as Lipitor 80 mg.  She states the visual disturbances have now resolved but she has noticed slight peripheral vision loss on the left which has persisted.  Patient had Covid infection in January 28 and had just gone to work 10 days later when she started not feeling well and her symptoms gradually worsened then she developed his visual symptoms.  Patient is currently  tolerating aspirin well without upset stomach, bruising or bleeding.  Her blood pressure has been difficult to control and primary care physician increase her medications yesterday and yet it is 150/95 today in my office.  Her fasting sugars are doing better now medication change.  She is tolerating Lipitor well without muscle aches and pains.  She denies any prior history of strokes TIA seizures migraines or significant neurological problems.  There is no family history of stroke. Update 07/28/2020: She returns for follow-up after last visit 3 and half months ago.  She is accompanied by her daughter.  Patient complains of intermittent feeling of heart all over her head off and on for the last few days.  This lasted only few minutes.  On only 1 such occasion she had slight blurred vision.  She denies any accompanying headache, double vision, vertigo, gait or balance problems.  I had suggested patient undergo TEE at last visit but she refused.  She states her left-sided peripheral vision loss appears to have improved.  She had lab work on 07/02/2020 which showed LDL cholesterol to be optimal at 62 mg percent.  Patient remains on Lipitor 80 mg daily but does complain of muscle aches and pains.  She states her blood pressure is under good control and today it is 137/58.  Patient states her sugars are also doing better and there is no recent hemoglobin A1c.  She is also complaining of memory difficulties trouble remembering recent information which seem to have started since her stroke and is not improving.  Update 12/24/2020  JM: Returns for follow-up after prior visit with Dr. Leonie Man approximately 5 months ago.  Overall stable from stroke standpoint without new stroke/TIA symptoms.  Reports residual ***.  She has since been enrolled in [place] stroke trial currently either on aspirin or apixaban tolerating without side effects.  Remains on atorvastatin 80 mg daily without side effects.  Blood pressure today ***.        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    Comment: full time  Tobacco Use   Smoking status: Never   Smokeless tobacco: Never  Vaping Use   Vaping Use: Never used  Substance and Sexual Activity   Alcohol use: Not Currently   Drug use: Never   Sexual activity: Not Currently    Birth control/protection: Surgical    Comment: tubal  Other Topics Concern   Not on file  Social History Narrative   Lives alone   Right Handed   Drinks caffeine 3xweek   Social Determinants of Health   Financial Resource Strain: Low Risk    Difficulty of Paying Living Expenses: Not hard at all  Food Insecurity: No Food Insecurity   Worried About Charity fundraiser in the Last Year: Never true   [place] in the Last Year: Never true  Transportation Needs: No Transportation Needs   Lack of Transportation (Medical): No   Lack of Transportation (Non-Medical): No  Physical Activity: Insufficiently Active   Days of Exercise per Week: 4 days   Minutes of Exercise per Session: 10 min  Stress: No Stress Concern Present   Feeling of Stress : Not at all  Social Connections: Moderately Isolated   Frequency of Communication with Friends and Family: More than three times a week   Frequency of Social Gatherings with Friends and Family: Once a week   Attends Religious Services: 1 to 4 times per year   Active Member of [employer] or  Organizations: No   Attends Archivist Meetings: Never   Marital Status: Widowed  Human resources officer Violence: Not At Risk   Fear of Current or Ex-Partner: No   Emotionally Abused: No   Physically Abused: No   Sexually Abused: No    Medications:   Current Outpatient Medications on File Prior to Visit  Medication Sig Dispense Refill   amLODipine (NORVASC) 10 MG tablet Take 1 tablet (10 mg total) by mouth daily. 90 tablet 3   apixaban (ELIQUIS) 5 MG TABS tablet Take 5 mg by mouth 2 (two) times daily.     Aspirin 81 MG CAPS Take 1 capsule by mouth daily.     atorvastatin (LIPITOR) 80 MG tablet TAKE 1 TABLET BY MOUTH EVERYDAY AT BEDTIME 90 tablet 1   blood glucose meter kit and supplies Dispense based on patient and insurance preference. Use up to four times daily as directed. (FOR  ICD-10 E10.9, E11.9). 1 each 0   CALCIUM PO Take by mouth at bedtime.     dapagliflozin propanediol (FARXIGA) 5 MG TABS tablet Take 5 mg by mouth daily.     glucose blood (CVS GLUCOSE METER TEST STRIPS) test strip Test glucose  4 times per day- One Touch 100 each 2   hydrALAZINE (APRESOLINE) 25 MG tablet Take 1 tablet (25 mg total) by mouth 3 (three) times daily. 90 tablet 3   Lancets (ONETOUCH ULTRASOFT) lancets Use as instructed, can use up to 4 times per day. 100 each 12   lisinopril (ZESTRIL) 40 MG tablet Take 1 tablet (40 mg total) by mouth daily. 90 tablet 3   metFORMIN (GLUCOPHAGE) 500 MG tablet Take 1 tablet PO daily for 1 week, then take 1 tablet PO BID 180 tablet 3   progesterone (PROMETRIUM) 100 MG capsule Take 1 capsule (100 mg total) by mouth daily. 90 capsule 4   No current facility-administered medications on file prior to visit.    Allergies:  No Known Allergies  Physical Exam There were no vitals filed for this visit. There is no height or weight on file to calculate BMI.   General: Mildly obese middle-aged African-American lady, seated, in no evident distress Head: head  normocephalic and atraumatic.   Neck: supple with no carotid or supraclavicular bruits Cardiovascular: regular rate and rhythm, no murmurs Musculoskeletal: no deformity Skin:  no rash/petichiae Vascular:  Normal pulses all extremities  Neurologic Exam Mental Status: Awake and fully alert. Oriented to place and time. Recent and remote memory intact. Attention span, concentration and fund of knowledge appropriate. Mood and affect appropriate.  Diminished recall 2/3.  Able to name only 8 animals which can walk on 4 legs. Cranial Nerves: Pupils equal, briskly reactive to light. Extraocular movements full without nystagmus. Visual fields show partial left homonymous hemianopsia to confrontation. Hearing intact. Facial sensation intact. Face, tongue, palate moves normally and symmetrically.  Motor: Normal bulk and tone. Normal strength in all tested extremity muscles. Sensory.: intact to touch , pinprick , position and vibratory sensation.  Coordination: Rapid alternating movements normal in all extremities. Finger-to-nose and heel-to-shin performed accurately bilaterally. Gait and Station: Arises from chair without difficulty. Stance is normal. Gait demonstrates normal stride length and balance . Able to heel, toe and tandem walk without difficulty.  Reflexes: 1+ and symmetric. Toes downgoing.        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  Vascular risk factors of diabetes, hypertension, hyperlipidemia and obesity.  She is currently participating in [place] stroke trial through [place] participation in [place] trial (either on asa or apixaban) and continue atorvastatin for secondary stroke prevention -Continue close follow-up with PCP for aggressive stroke risk factor management to maintain strict control of hypertension with blood pressure goal below 130/90, diabetes with hemoglobin  A1c goal below 6.5% and lipids with LDL cholesterol goal below 70 mg/dL.  I advised the patient to increase participation in cognitively challenging activities like solving crossword puzzles, playing bridge and sodoku.  We also discussed memory compensation strategies.         CC:  [facility] provider: Noreene Larsson, NP   I spent *** minutes of face-to-face and non-face-to-face time with patient.  This included previsit chart review, lab review, study review, order entry, electronic health record documentation, patient education  Frann Rider, AGNP-BC  [facility] [address] [place],  [place] [address]  Phone [phone] Fax [phone] Note: This document was prepared with digital dictation and possible smart phrase technology. Any transcriptional errors that result from this process are unintentional.

## 2021-01-04 ENCOUNTER — Encounter: Payer: Self-pay | Admitting: Nurse Practitioner

## 2021-01-05 NOTE — Telephone Encounter (Signed)
That's fine as long as she had a visit.

## 2021-01-05 NOTE — Telephone Encounter (Signed)
Oh, I see the issue now. It is Dec, and she wants a not without being seen for issues in the future. She will need to be seen to discuss this.

## 2021-01-06 DIAGNOSIS — E1165 Type 2 diabetes mellitus with hyperglycemia: Secondary | ICD-10-CM | POA: Diagnosis not present

## 2021-01-06 DIAGNOSIS — E782 Mixed hyperlipidemia: Secondary | ICD-10-CM | POA: Diagnosis not present

## 2021-01-06 DIAGNOSIS — I10 Essential (primary) hypertension: Secondary | ICD-10-CM | POA: Diagnosis not present

## 2021-01-07 LAB — CMP14+EGFR
ALT: 11 IU/L (ref 0–32)
AST: 18 IU/L (ref 0–40)
Albumin/Globulin Ratio: 1.8 (ref 1.2–2.2)
Albumin: 4.4 g/dL (ref 3.8–4.8)
Alkaline Phosphatase: 76 IU/L (ref 44–121)
BUN/Creatinine Ratio: 15 (ref 12–28)
BUN: 12 mg/dL (ref 8–27)
Bilirubin Total: 0.4 mg/dL (ref 0.0–1.2)
CO2: 21 mmol/L (ref 20–29)
Calcium: 9.1 mg/dL (ref 8.7–10.3)
Chloride: 107 mmol/L — ABNORMAL HIGH (ref 96–106)
Creatinine, Ser: 0.8 mg/dL (ref 0.57–1.00)
Globulin, Total: 2.5 g/dL (ref 1.5–4.5)
Glucose: 109 mg/dL — ABNORMAL HIGH (ref 70–99)
Potassium: 4.2 mmol/L (ref 3.5–5.2)
Sodium: 141 mmol/L (ref 134–144)
Total Protein: 6.9 g/dL (ref 6.0–8.5)
eGFR: 84 mL/min/{1.73_m2} (ref >59)

## 2021-01-07 LAB — CBC WITH DIFFERENTIAL/PLATELET
Basophils Absolute: 0 10*3/uL (ref 0.0–0.2)
Basos: 0 %
EOS (ABSOLUTE): 0.1 10*3/uL (ref 0.0–0.4)
Eos: 1 %
Hematocrit: 43.1 % (ref 34.0–46.6)
Hemoglobin: 13.4 g/dL (ref 11.1–15.9)
Immature Grans (Abs): 0 10*3/uL (ref 0.0–0.1)
Immature Granulocytes: 0 %
Lymphocytes Absolute: 4.1 10*3/uL — ABNORMAL HIGH (ref 0.7–3.1)
Lymphs: 49 %
MCH: 25.5 pg — ABNORMAL LOW (ref 26.6–33.0)
MCHC: 31.1 g/dL — ABNORMAL LOW (ref 31.5–35.7)
MCV: 82 fL (ref 79–97)
Monocytes Absolute: 0.6 10*3/uL (ref 0.1–0.9)
Monocytes: 7 %
Neutrophils Absolute: 3.7 10*3/uL (ref 1.4–7.0)
Neutrophils: 43 %
Platelets: 334 10*3/uL (ref 150–450)
RBC: 5.25 x10E6/uL (ref 3.77–5.28)
RDW: 14.3 % (ref 11.7–15.4)
WBC: 8.5 10*3/uL (ref 3.4–10.8)

## 2021-01-07 LAB — LIPID PANEL WITH LDL/HDL RATIO
Cholesterol, Total: 208 mg/dL — ABNORMAL HIGH (ref 100–199)
HDL: 63 mg/dL (ref >39)
LDL Chol Calc (NIH): 131 mg/dL — ABNORMAL HIGH (ref 0–99)
LDL/HDL Ratio: 2.1 ratio (ref 0.0–3.2)
Triglycerides: 81 mg/dL (ref 0–149)
VLDL Cholesterol Cal: 14 mg/dL (ref 5–40)

## 2021-01-07 NOTE — Progress Notes (Signed)
Cholesterol is elevated. We will discuss this at our appt on 12/29.

## 2021-01-15 ENCOUNTER — Encounter: Payer: Self-pay | Admitting: Nurse Practitioner

## 2021-01-15 ENCOUNTER — Ambulatory Visit: Payer: Federal, State, Local not specified - PPO | Admitting: Nurse Practitioner

## 2021-01-15 ENCOUNTER — Other Ambulatory Visit: Payer: Self-pay

## 2021-01-15 VITALS — BP 142/82 | HR 105 | Ht 62.0 in | Wt 216.0 lb

## 2021-01-15 DIAGNOSIS — E782 Mixed hyperlipidemia: Secondary | ICD-10-CM | POA: Diagnosis not present

## 2021-01-15 DIAGNOSIS — I639 Cerebral infarction, unspecified: Secondary | ICD-10-CM

## 2021-01-15 DIAGNOSIS — I10 Essential (primary) hypertension: Secondary | ICD-10-CM | POA: Diagnosis not present

## 2021-01-15 MED ORDER — REPATHA 140 MG/ML ~~LOC~~ SOSY: 140.0000 mg | PREFILLED_SYRINGE | SUBCUTANEOUS | 3 refills | Status: DC

## 2021-01-15 MED ORDER — ROSUVASTATIN CALCIUM 40 MG PO TABS
40.0000 mg | ORAL_TABLET | Freq: Every day | ORAL | 3 refills | Status: AC
Start: 2021-01-15 — End: ?

## 2021-01-15 NOTE — Assessment & Plan Note (Addendum)
Lab Results  Component Value Date   CHOL 208 (H) 01/06/2021   HDL 63 01/06/2021   LDLCALC 131 (H) 01/06/2021   TRIG 81 01/06/2021   CHOLHDL 2.5 04/08/2020   -LDL above goal; goal LDL < 55 with DM and hx of CVA -Rx. Repatha since she has been taking max dose atorvastatin -STOP atorvastatin -Rx. rosuvastatin

## 2021-01-15 NOTE — Assessment & Plan Note (Addendum)
BP Readings from Last 3 Encounters:  01/15/21 (!) 142/82  09/15/20 137/86  09/01/20 138/90    -BP elevated today, but she hasn't had her meds this morning -BP was good for the last few visits, and she states she is nervous today -no med changes, may revisit at next OV if still elevated

## 2021-01-15 NOTE — Patient Instructions (Addendum)
Please have fasting labs drawn 2-3 days prior to your appointment so we can discuss the results during your office visit.   I will be moving to [place] located at [address] [place], [place] [address] effective Jan 18, 2021. If you would like to establish care with [facility] please call [phone].

## 2021-01-15 NOTE — Assessment & Plan Note (Signed)
-  completed disability paperwork

## 2021-01-15 NOTE — Progress Notes (Signed)
Acute Office Visit  Subjective:    Patient ID: Martha Jensen, female    DOB: 08-16-1960, 60 y.o.   MRN: [id number]  Chief Complaint  Patient presents with   Follow-up    4 month follow up    HPI Patient is in today for lab follow-up for HTN, HLD, T2DM and hx of CVA.  She needs disability forms filled out for her work/insurance following her stroke. She states that she still has numbness and pain.  She is followed by Dr. Dorris Fetch for T2DM.  She states that hasn't been taking atorvastatin since her last labs. She states that she felt like her legs were heavy when she was taking this.  She states she has been taking her BP meds, and the last time she took them was last night.  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  Tobacco Use   Smoking status: Never   Smokeless tobacco: Never  Vaping Use   Vaping Use: Never used  Substance and Sexual Activity   Alcohol use: Not Currently   Drug use: Never   Sexual activity: Not Currently    Birth control/protection: Surgical    Comment: tubal  Other Topics Concern   Not on file  Social History Narrative   Lives alone   Right Handed   Drinks caffeine 3xweek   Social Determinants of Health   Financial Resource Strain: Low Risk    Difficulty of Paying Living Expenses: Not hard at all  Food Insecurity: No Food Insecurity   Worried About Sales executive in the Last Year: Never true   [place] in the Last Year: Never true  Transportation Needs: No Transportation Needs   Lack of Transportation (Medical): No   Lack of Transportation (Non-Medical): No  Physical Activity: Insufficiently Active   Days of Exercise per Week: 4 days   Minutes of Exercise per Session: 10 min  Stress: No Stress Concern Present   Feeling of Stress : Not at all  Social Connections: Moderately Isolated   Frequency of Communication with Friends and Family: More than three times a week   Frequency of Social Gatherings with Friends and Family: Once a week   Attends Religious Services: 1 to 4 times per year   Active Member of [employer] or Organizations: No   Attends Archivist Meetings: Never   Marital Status: Widowed  Human resources officer Violence: Not At Risk   Fear of Current or Ex-Partner: No   Emotionally Abused: No   Physically Abused: No   Sexually Abused: No    Outpatient Medications Prior to Visit  Medication Sig Dispense Refill   amLODipine (NORVASC) 10 MG tablet Take 1 tablet (10 mg total) by mouth daily. 90 tablet 3   apixaban (ELIQUIS) 5  MG TABS tablet Take 5 mg by mouth 2 (two) times daily.     Aspirin 81 MG CAPS Take 1 capsule by mouth daily.     blood glucose meter kit and supplies Dispense based on patient and insurance preference. Use up to four times daily as directed. (FOR ICD-10 E10.9, E11.9). 1 each 0   CALCIUM PO Take by mouth at bedtime.     dapagliflozin propanediol (FARXIGA) 5 MG TABS tablet Take 5 mg by mouth daily.     glucose blood ([employer] GLUCOSE METER TEST STRIPS) test strip Test glucose  4 times per day- One Touch 100 each 2   hydrALAZINE (APRESOLINE) 25 MG tablet Take 1 tablet (25 mg total) by mouth 3 (three) times daily. 90 tablet 3   Lancets (ONETOUCH ULTRASOFT) lancets Use as instructed, can use up to 4 times per day. 100 each 12   lisinopril (ZESTRIL) 40 MG tablet Take 1 tablet (40 mg total) by mouth daily. 90 tablet  3   metFORMIN (GLUCOPHAGE) 500 MG tablet Take 1 tablet PO daily for 1 week, then take 1 tablet PO BID 180 tablet 3   atorvastatin (LIPITOR) 80 MG tablet TAKE 1 TABLET BY MOUTH EVERYDAY AT BEDTIME 90 tablet 1   progesterone (PROMETRIUM) 100 MG capsule Take 1 capsule (100 mg total) by mouth daily. 90 capsule 4   No facility-administered medications prior to visit.    No Known Allergies  Review of Systems  Constitutional: Negative.   Respiratory: Negative.    Cardiovascular: Negative.   Musculoskeletal:  Positive for myalgias.       When she takes atorvastatin  Neurological:  Positive for numbness.       Chronic for her post-CVA      Objective:    Physical Exam Constitutional:      Appearance: Normal appearance.  Cardiovascular:     Rate and Rhythm: Normal rate and regular rhythm.     Pulses: Normal pulses.     Heart sounds: Normal heart sounds.  Pulmonary:     Effort: Pulmonary effort is normal.     Breath sounds: Normal breath sounds.  Neurological:     Mental Status: She is alert.  Psychiatric:        Mood and Affect: Mood normal.        Behavior: Behavior normal.        Thought Content: Thought content normal.        Judgment: Judgment normal.    BP (!) 142/82    Pulse (!) 105    Ht 5' 2"  (1.575 m)    Wt 216 lb 0.6 oz (98 kg)    SpO2 96%    BMI 39.51 kg/m  Wt Readings from Last 3 Encounters:  01/15/21 216 lb 0.6 oz (98 kg)  09/15/20 209 lb (94.8 kg)  09/01/20 210 lb (95.3 kg)    Health Maintenance Due  Topic Date Due   HIV Screening  Never done   COLONOSCOPY (Pts 45-84yr Insurance coverage will need to be confirmed)  Never done   MAMMOGRAM  Never done   Pneumococcal Vaccine 18362Years old (2 - PCV) 04/04/2021    There are no preventive care reminders to display for this patient.   Lab Results  Component Value Date   TSH 1.010 04/08/2020   Lab Results  Component Value Date   WBC 8.5 01/06/2021   HGB 13.4 01/06/2021   HCT 43.1 01/06/2021   MCV 82  01/06/2021   PLT 334 01/06/2021  Lab Results  Component Value Date   NA 141 01/06/2021   K 4.2 01/06/2021   CO2 21 01/06/2021   GLUCOSE 109 (H) 01/06/2021   BUN 12 01/06/2021   CREATININE 0.80 01/06/2021   BILITOT 0.4 01/06/2021   ALKPHOS 76 01/06/2021   AST 18 01/06/2021   ALT 11 01/06/2021   PROT 6.9 01/06/2021   ALBUMIN 4.4 01/06/2021   CALCIUM 9.1 01/06/2021   EGFR 84 01/06/2021   Lab Results  Component Value Date   CHOL 208 (H) 01/06/2021   Lab Results  Component Value Date   HDL 63 01/06/2021   Lab Results  Component Value Date   LDLCALC 131 (H) 01/06/2021   Lab Results  Component Value Date   TRIG 81 01/06/2021   Lab Results  Component Value Date   CHOLHDL 2.5 04/08/2020   Lab Results  Component Value Date   HGBA1C 6.6 09/01/2020       Assessment & Plan:   Problem List Items Addressed This Visit       Cardiovascular and Mediastinum   Essential hypertension, benign    BP Readings from Last 3 Encounters:  01/15/21 (!) 142/82  09/15/20 137/86  09/01/20 138/90   -BP elevated today, but she hasn't had her meds this morning -BP was good for the last few visits, and she states she is nervous today -no med changes, may revisit at next OV if still elevated      Relevant Medications   Evolocumab (REPATHA) 140 MG/ML SOSY   rosuvastatin (CRESTOR) 40 MG tablet   Other Relevant Orders   CBC with Differential/Platelet   CMP14+EGFR   Lipid Panel With LDL/HDL Ratio     Nervous and Auditory   Occipital infarction ([place])    -completed disability paperwork      Relevant Medications   Evolocumab (REPATHA) 140 MG/ML SOSY   rosuvastatin (CRESTOR) 40 MG tablet     Other   Mixed hyperlipidemia - Primary    Lab Results  Component Value Date   CHOL 208 (H) 01/06/2021   HDL 63 01/06/2021   LDLCALC 131 (H) 01/06/2021   TRIG 81 01/06/2021   CHOLHDL 2.5 04/08/2020  -LDL above goal; goal LDL < 55 with DM and hx of CVA -Rx. Repatha since she has been  taking max dose atorvastatin -STOP atorvastatin -Rx. rosuvastatin       Relevant Medications   Evolocumab (REPATHA) 140 MG/ML SOSY   rosuvastatin (CRESTOR) 40 MG tablet   Other Relevant Orders   CBC with Differential/Platelet   CMP14+EGFR   Lipid Panel With LDL/HDL Ratio     Meds ordered this encounter  Medications   Evolocumab (REPATHA) 140 MG/ML SOSY    Sig: Inject 140 mg into the skin every 14 (fourteen) days.    Dispense:  2.1 mL    Refill:  3   rosuvastatin (CRESTOR) 40 MG tablet    Sig: Take 1 tablet (40 mg total) by mouth daily.    Dispense:  90 tablet    Refill:  [place], NP

## 2021-01-16 NOTE — Telephone Encounter (Signed)
We gave it to her, but it should have been scanned in. If it is in her chart, please bring me a copy and I can put "ongoing" in there.

## 2021-01-20 NOTE — Telephone Encounter (Signed)
Will hold until scan center enters in her chart

## 2021-02-03 ENCOUNTER — Encounter: Payer: Self-pay | Admitting: Internal Medicine

## 2021-02-12 NOTE — Telephone Encounter (Signed)
NO VM.

## 2021-02-25 ENCOUNTER — Ambulatory Visit: Payer: Federal, State, Local not specified - PPO | Admitting: Internal Medicine

## 2021-03-04 ENCOUNTER — Other Ambulatory Visit: Payer: Self-pay | Admitting: "Endocrinology

## 2021-03-04 ENCOUNTER — Encounter: Payer: Self-pay | Admitting: Internal Medicine

## 2021-03-04 ENCOUNTER — Encounter: Payer: Self-pay | Admitting: "Endocrinology

## 2021-03-04 ENCOUNTER — Ambulatory Visit: Payer: Federal, State, Local not specified - PPO | Admitting: Internal Medicine

## 2021-03-04 ENCOUNTER — Other Ambulatory Visit: Payer: Self-pay

## 2021-03-04 ENCOUNTER — Ambulatory Visit: Payer: Federal, State, Local not specified - PPO | Admitting: "Endocrinology

## 2021-03-04 VITALS — BP 142/84 | HR 94 | Resp 20 | Ht 62.0 in | Wt 219.1 lb

## 2021-03-04 VITALS — BP 120/88 | HR 92 | Ht 62.0 in | Wt 218.0 lb

## 2021-03-04 DIAGNOSIS — I6930 Unspecified sequelae of cerebral infarction: Secondary | ICD-10-CM | POA: Diagnosis not present

## 2021-03-04 DIAGNOSIS — E782 Mixed hyperlipidemia: Secondary | ICD-10-CM | POA: Diagnosis not present

## 2021-03-04 DIAGNOSIS — F321 Major depressive disorder, single episode, moderate: Secondary | ICD-10-CM | POA: Insufficient documentation

## 2021-03-04 DIAGNOSIS — E66812 Obesity, class 2: Secondary | ICD-10-CM | POA: Insufficient documentation

## 2021-03-04 DIAGNOSIS — E1169 Type 2 diabetes mellitus with other specified complication: Secondary | ICD-10-CM

## 2021-03-04 DIAGNOSIS — Z6839 Body mass index (BMI) 39.0-39.9, adult: Secondary | ICD-10-CM

## 2021-03-04 DIAGNOSIS — E1165 Type 2 diabetes mellitus with hyperglycemia: Secondary | ICD-10-CM | POA: Diagnosis not present

## 2021-03-04 DIAGNOSIS — I10 Essential (primary) hypertension: Secondary | ICD-10-CM

## 2021-03-04 LAB — POCT GLYCOSYLATED HEMOGLOBIN (HGB A1C): HbA1c, POC (controlled diabetic range): 6.7 % (ref 0.0–7.0)

## 2021-03-04 MED ORDER — METFORMIN HCL ER 500 MG PO TB24
500.0000 mg | ORAL_TABLET | Freq: Every day | ORAL | 1 refills | Status: DC
Start: 2021-03-04 — End: 2021-09-07

## 2021-03-04 MED ORDER — METFORMIN HCL ER (MOD) 500 MG PO TB24: 500.0000 mg | ORAL_TABLET | Freq: Every day | ORAL | 1 refills | Status: DC

## 2021-03-04 NOTE — Progress Notes (Signed)
Established Patient Office Visit  Subjective:  Patient ID: Martha Jensen, female    DOB: [birth year]  Age: 61 y.o. MRN: [id number]  CC:  Chief Complaint  Patient presents with   Follow-up    Pt needs disability forms filled out had stroke 03-12-20    HPI Martha Jensen is a 61 y.o. female with past medical history of CVA, HTN, type II DM, HLD and depression who presents for f/u of her chronic medical conditions.  HTN: Her BP was elevated in the office today.  She appears to be anxious today.  She denies any chest pain, dyspnea or palpitations currently.  Type II DM: She is on metformin and Farxiga currently, followed by Dr. Dorris Fetch.  Denies any polyuria or polydipsia currently.  CVA: She still has residual numbness and weakness of the UE and LE since the CVA.  She reports multiple falls at home, but does not use any walking support currently.  She agrees to start using cane for walking support. She has not been able to go back to work yet.  She has seen neurologist for it.  She also reports having mood symptoms since the CVA and inability to control her emotions at times.  She denies any SI or HI currently.  Past Medical History:  Diagnosis Date   Diabetes mellitus without complication ([place])    Hyperlipidemia    Hypertension    Stroke 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  Tobacco Use   Smoking status: Never   Smokeless tobacco: Never  Vaping Use   Vaping Use: Never used  Substance and Sexual Activity   Alcohol use: Not  Currently   Drug use: Never   Sexual activity: Not Currently    Birth control/protection: Surgical    Comment: tubal  Other Topics Concern   Not on file  Social History Narrative   Lives alone   Right Handed   Drinks caffeine 3xweek   Social Determinants of Health   Financial Resource Strain: Low Risk    Difficulty of Paying Living Expenses: Not hard at all  Food Insecurity: No Food Insecurity   Worried About Charity fundraiser in the Last Year: Never true   [place] in the Last Year: Never true  Transportation Needs: No Transportation Needs   Lack of Transportation (Medical): No   Lack of Transportation (Non-Medical): No  Physical Activity: Insufficiently Active   Days of Exercise per Week: 4 days   Minutes of Exercise per Session: 10 min  Stress: No Stress Concern Present   Feeling of Stress : Not at all  Social Connections: Moderately Isolated   Frequency of Communication with Friends and Family: More than three times a week   Frequency of Social Gatherings with Friends and Family: Once a week   Attends Religious Services: 1 to 4 times per year   Active Member of Clubs or Organizations: No  Attends Archivist Meetings: Never   Marital Status: Widowed  Human resources officer Violence: Not At Risk   Fear of Current or Ex-Partner: No   Emotionally Abused: No   Physically Abused: No   Sexually Abused: No    Outpatient Medications Prior to Visit  Medication Sig Dispense Refill   amLODipine (NORVASC) 10 MG tablet Take 1 tablet (10 mg total) by mouth daily. 90 tablet 3   apixaban (ELIQUIS) 5 MG TABS tablet Take 5 mg by mouth 2 (two) times daily.     Aspirin 81 MG CAPS Take 1 capsule by mouth daily.     blood glucose meter kit and supplies Dispense based on patient and insurance preference. Use up to four times daily as directed. (FOR ICD-10 E10.9, E11.9). 1 each 0   CALCIUM PO Take by mouth at bedtime.     dapagliflozin propanediol (FARXIGA) 5 MG TABS tablet  Take 5 mg by mouth daily.     glucose blood (CVS GLUCOSE METER TEST STRIPS) test strip Test glucose  4 times per day- One Touch 100 each 2   hydrALAZINE (APRESOLINE) 25 MG tablet Take 1 tablet (25 mg total) by mouth 3 (three) times daily. 90 tablet 3   Lancets (ONETOUCH ULTRASOFT) lancets Use as instructed, can use up to 4 times per day. 100 each 12   lisinopril (ZESTRIL) 40 MG tablet Take 1 tablet (40 mg total) by mouth daily. 90 tablet 3   metFORMIN (GLUCOPHAGE-XR) 500 MG 24 hr tablet Take 1 tablet (500 mg total) by mouth daily with breakfast. 90 tablet 1   rosuvastatin (CRESTOR) 40 MG tablet Take 1 tablet (40 mg total) by mouth daily. 90 tablet 3   progesterone (PROMETRIUM) 100 MG capsule Take 1 capsule (100 mg total) by mouth daily. 90 capsule 4   No facility-administered medications prior to visit.    No Known Allergies  ROS Review of Systems  Constitutional:  Positive for fatigue. Negative for chills and fever.  HENT:  Negative for congestion, sinus pressure, sinus pain and sore throat.   Eyes:  Negative for pain and discharge.  Respiratory:  Negative for cough and shortness of breath.   Cardiovascular:  Negative for chest pain and palpitations.  Gastrointestinal:  Negative for abdominal pain, diarrhea, nausea and vomiting.  Endocrine: Negative for polydipsia and polyuria.  Genitourinary:  Negative for dysuria and hematuria.  Musculoskeletal:  Positive for arthralgias and gait problem. Negative for neck pain and neck stiffness.  Skin:  Negative for rash.  Neurological:  Positive for dizziness, speech difficulty, weakness and numbness.  Psychiatric/Behavioral:  Positive for dysphoric mood. Negative for agitation and behavioral problems. The patient is nervous/anxious.      Objective:    Physical Exam Vitals reviewed.  Constitutional:      General: She is not in acute distress.    Appearance: She is not diaphoretic.  HENT:     Head: Normocephalic and atraumatic.     Nose:  Nose normal. No congestion.     Mouth/Throat:     Mouth: Mucous membranes are moist.     Pharynx: No posterior oropharyngeal erythema.  Eyes:     General: No scleral icterus.    Extraocular Movements: Extraocular movements intact.  Cardiovascular:     Rate and Rhythm: Normal rate and regular rhythm.     Pulses: Normal pulses.     Heart sounds: Normal heart sounds. No murmur heard. Pulmonary:     Breath sounds: Normal breath sounds. No wheezing or  rales.  Musculoskeletal:     Cervical back: Neck supple. No tenderness.     Right lower leg: No edema.     Left lower leg: No edema.  Skin:    General: Skin is warm.     Findings: No rash.  Neurological:     General: No focal deficit present.     Mental Status: She is alert and oriented to person, place, and time.     Sensory: Sensory deficit (Paresthesia - B/l hands and LE) present.     Motor: Weakness (4/5 in b/l UE and LE) present.  Psychiatric:        Mood and Affect: Mood normal.        Behavior: Behavior normal.    BP (!) 142/84 (BP Location: Right Arm, Cuff Size: Normal)    Pulse 94    Resp 20    Ht 5' 2"  (1.575 m)    Wt 219 lb 1.9 oz (99.4 kg)    SpO2 100%    BMI 40.08 kg/m  Wt Readings from Last 3 Encounters:  03/04/21 219 lb 1.9 oz (99.4 kg)  03/04/21 218 lb (98.9 kg)  01/15/21 216 lb 0.6 oz (98 kg)    Lab Results  Component Value Date   TSH 1.010 04/08/2020   Lab Results  Component Value Date   WBC 8.5 01/06/2021   HGB 13.4 01/06/2021   HCT 43.1 01/06/2021   MCV 82 01/06/2021   PLT 334 01/06/2021   Lab Results  Component Value Date   NA 141 01/06/2021   K 4.2 01/06/2021   CO2 21 01/06/2021   GLUCOSE 109 (H) 01/06/2021   BUN 12 01/06/2021   CREATININE 0.80 01/06/2021   BILITOT 0.4 01/06/2021   ALKPHOS 76 01/06/2021   AST 18 01/06/2021   ALT 11 01/06/2021   PROT 6.9 01/06/2021   ALBUMIN 4.4 01/06/2021   CALCIUM 9.1 01/06/2021   EGFR 84 01/06/2021   Lab Results  Component Value Date   CHOL 208 (H)  01/06/2021   Lab Results  Component Value Date   HDL 63 01/06/2021   Lab Results  Component Value Date   LDLCALC 131 (H) 01/06/2021   Lab Results  Component Value Date   TRIG 81 01/06/2021   Lab Results  Component Value Date   CHOLHDL 2.5 04/08/2020   Lab Results  Component Value Date   HGBA1C 6.7 03/04/2021      Assessment & Plan:   Problem List Items Addressed This Visit       Cardiovascular and Mediastinum   Essential hypertension, benign - Primary    BP Readings from Last 1 Encounters:  03/04/21 (!) 142/84  uncontrolled with Lisinopril, likely elevated due to anxiety Counseled for compliance with the medications Advised DASH diet and moderate exercise/walking, at least 150 mins/week        Endocrine   Type 2 diabetes mellitus with other specified complication ([place])    Lab Results  Component Value Date   HGBA1C 6.7 03/04/2021   On Metformin and Farxiga, followed by Endocrinology Advised to follow diabetic diet On statin and ACEi        Other   History of CVA with residual deficit    Has residual sensory and motor deficits from CVA On aspirin, Eliquis and statin currently Followed by neurology Disability paperwork completed today      Depression, major, single episode, moderate (HCC)    With mood symptoms since CVA Could be residual emotional effects from CVA  versus adjustment disorder Referred to [facility] therapy for now      Relevant Orders   AMB Referral to [place]    No orders of the defined types were placed in this encounter.   Follow-up: Return if symptoms worsen or fail to improve.    Lindell Spar, MD

## 2021-03-04 NOTE — Patient Instructions (Signed)
Please continue to take medications as prescribed.  Please continue to follow low carb diet and ambulate as tolerated. Please use walking support to avoid fall.

## 2021-03-04 NOTE — Progress Notes (Signed)
03/04/2021, 11:23 AM  Endocrinology follow-up note   Subjective:    Patient ID: Martha Jensen, female    DOB: 25-Sep-1960.  Martha Jensen is being seen in follow-up after he was seen in consultation for management of currently uncontrolled symptomatic diabetes requested by  Noreene Larsson, NP.   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  Tobacco Use   Smoking status: Never   Smokeless tobacco: Never  Vaping Use   Vaping Use: Never used  Substance and Sexual Activity   Alcohol use: Not Currently   Drug use: Never   Sexual activity: Not Currently    Birth control/protection: Surgical    Comment: tubal  Other Topics Concern   Not on file  Social History Narrative   Lives alone   Right Handed   Drinks caffeine 3xweek   Social Determinants of Health   Financial Resource Strain: Low Risk    Difficulty of Paying Living Expenses: Not hard at all  Food Insecurity: No Food Insecurity   Worried About Charity fundraiser in the Last Year: Never true   [place] in the Last Year: Never true  Transportation Needs: No Transportation Needs   Lack of Transportation (Medical): No   Lack of Transportation (Non-Medical): No  Physical Activity: Insufficiently Active   Days of Exercise per Week: 4 days   Minutes of Exercise per Session: 10 min  Stress: No Stress Concern Present   Feeling of Stress :  Not at all  Social Connections: Moderately Isolated   Frequency of Communication with Friends and Family: More than three times a week   Frequency of Social Gatherings with Friends and Family: Once a week   Attends Religious Services: 1 to 4 times per year   Active Member of [employer] or Organizations: No   Attends Archivist Meetings: Never   Marital Status: Widowed    Family History  Problem Relation Age of Onset   Heart attack Brother     Outpatient Encounter Medications as of 03/04/2021  Medication Sig   metformin (GLUMETZA) 500 MG (MOD) 24 hr tablet Take 1 tablet (500 mg total) by mouth daily with breakfast.   amLODipine (NORVASC) 10 MG tablet Take 1 tablet (10 mg total) by mouth daily.   apixaban (ELIQUIS) 5 MG TABS tablet Take 5 mg by mouth 2 (two) times daily.   Aspirin 81 MG CAPS Take 1 capsule by mouth daily.   blood glucose meter kit and supplies Dispense based on patient and insurance preference. Use up to four  times daily as directed. (FOR ICD-10 E10.9, E11.9).   CALCIUM PO Take by mouth at bedtime.   dapagliflozin propanediol (FARXIGA) 5 MG TABS tablet Take 5 mg by mouth daily.   glucose blood ([employer] GLUCOSE METER TEST STRIPS) test strip Test glucose  4 times per day- One Touch   hydrALAZINE (APRESOLINE) 25 MG tablet Take 1 tablet (25 mg total) by mouth 3 (three) times daily.   Lancets (ONETOUCH ULTRASOFT) lancets Use as instructed, can use up to 4 times per day.   lisinopril (ZESTRIL) 40 MG tablet Take 1 tablet (40 mg total) by mouth daily.   progesterone (PROMETRIUM) 100 MG capsule Take 1 capsule (100 mg total) by mouth daily.   rosuvastatin (CRESTOR) 40 MG tablet Take 1 tablet (40 mg total) by mouth daily.   [DISCONTINUED] Evolocumab (REPATHA) 140 MG/ML SOSY Inject 140 mg into the skin every 14 (fourteen) days.   [DISCONTINUED] metFORMIN (GLUCOPHAGE) 500 MG tablet Take 1 tablet PO daily for 1 week, then take 1 tablet PO BID   No facility-administered encounter  medications on file as of 03/04/2021.    ALLERGIES: No Known Allergies  VACCINATION STATUS: Immunization History  Administered Date(s) Administered   PFIZER(Purple Top)SARS-COV-2 Vaccination 10/10/2019, 10/31/2019   Pneumococcal Polysaccharide-23 04/04/2020   Diabetes She presents for her follow-up diabetic visit. She has type 2 diabetes mellitus. Onset time: Patient was diagnosed at approximate age of 61 years. Her disease course has been improving. There are no hypoglycemic associated symptoms. Pertinent negatives for hypoglycemia include no confusion, headaches, pallor or seizures. Pertinent negatives for diabetes include no blurred vision, no chest pain, no fatigue, no polydipsia, no polyphagia and no polyuria. Symptoms are improving. Diabetic complications include a CVA. Risk factors for coronary artery disease include diabetes mellitus, dyslipidemia, hypertension, obesity, family history, sedentary lifestyle and post-menopausal. Current diabetic treatments: She is currently on Metformin 500 mg p.o. twice daily, Farxiga 10 mg p.o. daily. Her weight is stable. She is following a generally unhealthy diet. When asked about meal planning, she reported none. She has not had a previous visit with a dietitian. She rarely participates in exercise. Her home blood glucose trend is decreasing steadily. Her breakfast blood glucose range is generally 130-140 mg/dl. Her overall blood glucose range is 130-140 mg/dl. (She presents with continued improvement in her glycemic profile.  Her point-of-care A1c is 6.7% overall improving from greater than 16% in 2022.  She did not document any hypoglycemia.  She reports some side effects from metformin including nausea, no vomiting.  ) An ACE inhibitor/angiotensin II receptor blocker is being taken. Eye exam is current.    Review of Systems  Constitutional:  Negative for chills, fatigue, fever and unexpected weight change.  HENT:  Negative for trouble swallowing and  voice change.   Eyes:  Negative for blurred vision and visual disturbance.  Respiratory:  Negative for cough, shortness of breath and wheezing.   Cardiovascular:  Negative for chest pain, palpitations and leg swelling.  Gastrointestinal:  Negative for diarrhea, nausea and vomiting.  Endocrine: Negative for cold intolerance, heat intolerance, polydipsia, polyphagia and polyuria.  Musculoskeletal:  Negative for arthralgias and myalgias.  Skin:  Negative for color change, pallor, rash and wound.  Neurological:  Negative for seizures and headaches.  Psychiatric/Behavioral:  Negative for confusion and suicidal ideas.    Objective:    Vitals with BMI 03/04/2021 01/15/2021 09/15/2020  Height 5' 2"  5' 2"  5' 2"   Weight 218 lbs 216 lbs 1 oz 209 lbs  BMI 39.86  67.8 93.81  Systolic 017 510 258  Diastolic 88 82 86  Pulse 92 105 83    BP 120/88    Pulse 92    Ht 5' 2"  (1.575 m)    Wt 218 lb (98.9 kg)    BMI 39.87 kg/m   Wt Readings from Last 3 Encounters:  03/04/21 218 lb (98.9 kg)  01/15/21 216 lb 0.6 oz (98 kg)  09/15/20 209 lb (94.8 kg)       CMP ( most recent) CMP     Component Value Date/Time   NA 141 01/06/2021 1002   K 4.2 01/06/2021 1002   CL 107 (H) 01/06/2021 1002   CO2 21 01/06/2021 1002   GLUCOSE 109 (H) 01/06/2021 1002   BUN 12 01/06/2021 1002   CREATININE 0.80 01/06/2021 1002   CALCIUM 9.1 01/06/2021 1002   PROT 6.9 01/06/2021 1002   ALBUMIN 4.4 01/06/2021 1002   AST 18 01/06/2021 1002   ALT 11 01/06/2021 1002   ALKPHOS 76 01/06/2021 1002   BILITOT 0.4 01/06/2021 1002     Diabetic Labs (most recent): Lab Results  Component Value Date   HGBA1C 6.7 03/04/2021   HGBA1C 6.6 09/01/2020   HGBA1C 14.3 (H) 04/08/2020    Lipid Panel     Component Value Date/Time   CHOL 208 (H) 01/06/2021 1002   TRIG 81 01/06/2021 1002   HDL 63 01/06/2021 1002   CHOLHDL 2.5 04/08/2020 1407   LDLCALC 131 (H) 01/06/2021 1002   LABVLDL 14 01/06/2021 1002      Lab Results   Component Value Date   TSH 1.010 04/08/2020      Assessment & Plan:   1. Uncontrolled type 2 diabetes mellitus with hyperglycemia (HCC)  - Martha Jensen has currently uncontrolled symptomatic type 2 DM since  61 years of age.  She presents with continued improvement in her glycemic profile.  Her point-of-care A1c is 6.7% overall improving from greater than 16% in 2022.  She did not document any hypoglycemia.  She reports some side effects from metformin including nausea, no vomiting.    Recent labs reviewed. - I had a long discussion with her about the progressive nature of diabetes and the pathology behind its complications. -her diabetes is complicated by CVA, obesity/sedentary life and she remains at a high risk for more acute and chronic complications which include CAD, CVA, CKD, retinopathy, and neuropathy. These are all discussed in detail with her.  - I have counseled her on diet  and weight management  by adopting a carbohydrate restricted/protein rich diet. Patient is encouraged to switch to  unprocessed or minimally processed     complex starch and increased protein intake (animal or plant source), fruits, and vegetables. -  she is advised to stick to a routine mealtimes to eat 3 meals  a day and avoid unnecessary snacks ( to snack only to correct hypoglycemia).   -This patient particularly has problems with medications.  She is a good candidate for lifestyle medicine. - she acknowledges that there is a room for improvement in her food and drink choices. - Suggestion is made for her to avoid simple carbohydrates  from her diet including Cakes, Sweet Desserts, Ice Cream, Soda (diet and regular), Sweet Tea, Candies, Chips, Cookies, Store Bought Juices, Alcohol , Artificial Sweeteners,  Coffee Creamer, and "Sugar-free" Products, Lemonade. This will help patient to have more stable blood glucose profile and potentially avoid unintended weight gain.  The following Lifestyle Medicine  recommendations according  to [employer] of Lifestyle Medicine  [facility]) were discussed and and offered to patient and she  agrees to start the journey:  A. Whole Foods, Plant-Based Nutrition comprising of fruits and vegetables, plant-based proteins, whole-grain carbohydrates was discussed in detail with the patient.   A list for source of those nutrients were also provided to the patient.  Patient will use only water or unsweetened tea for hydration. B.  The need to stay away from risky substances including alcohol, smoking; obtaining 7 to 9 hours of restorative sleep, at least 150 minutes of moderate intensity exercise weekly, the importance of healthy social connections,  and stress management techniques were discussed. C.  A full color page of  Calorie density of various food groups per pound showing examples of each food groups was provided to the patient.    - she will be scheduled with Jearld Fenton, RDN, CDE for diabetes education.  - I have approached her with the following individualized plan to manage  her diabetes and patient agrees:   -Based on her presentation with near target glycemic profile, she will not need insulin treatment for now.  She is encouraged to continue monitoring blood glucose twice a day-daily before breakfast and at bedtime and call clinic for blood glucose levels less than 70 or above 200 mg /dl. -In the meantime, she would be switched to metformin 500 mg ER p.o. daily at breakfast.  She is advised to continue Farxiga 5 mg p.o. daily at breakfast as well.  - She is informed of side effects from these medications. - she will be considered for incretin therapy as appropriate next visit.  - Specific targets for  A1c;  LDL, HDL,  and Triglycerides were discussed with the patient.  2) Blood Pressure /Hypertension:  -Her blood pressure is controlled to target.  she is advised to continue her current medications including lisinopril 20 mg p.o. daily with breakfast  .   3) Lipids/Hyperlipidemia:   Review of her recent lipid panel showed  controlled  LDL at 61 .  she  is advised to continue Lipitor 80 mg p.o. daily at bedtime.     Side effects and precautions discussed with her.  4)  Weight/Diet:  Body mass index is 39.87 kg/m.  -   clearly complicating her diabetes care.   she is  a candidate for weight loss. I discussed with her the fact that loss of 5 - 10% of her  current body weight will have the most impact on her diabetes management.  Exercise, and detailed carbohydrates information provided  -  detailed on discharge instructions.  5) Chronic Care/Health Maintenance:  -she  is on ACEI/ARB and Statin medications and  is encouraged to initiate and continue to follow up with Ophthalmology, Dentist,  Podiatrist at least yearly or according to recommendations, and advised to   stay away from smoking. I have recommended yearly flu vaccine and pneumonia vaccine at least every 5 years; moderate intensity exercise for up to 150 minutes weekly; and  sleep for at least 7 hours a day.   Her screening ABI was negative for PAD in April 2022.  This study will be repeated in April 2027 or sooner if needed.   - she is  advised to maintain close follow up with Noreene Larsson, NP for primary care needs, as well as her other providers for optimal and coordinated care.   I spent 41 minutes in the care of the patient today including review of  labs from [place], Lipids, Thyroid Function, Hematology (current and previous including abstractions from other facilities); face-to-face time discussing  her blood glucose readings/logs, discussing hypoglycemia and hyperglycemia episodes and symptoms, medications doses, her options of short and long term treatment based on the latest standards of care / guidelines;  discussion about incorporating lifestyle medicine;  and documenting the encounter.    Please refer to Patient Instructions for Blood Glucose Monitoring and  Insulin/Medications Dosing Guide"  in media tab for additional information. Please  also refer to " Patient Self Inventory" in the Media  tab for reviewed elements of pertinent patient history.  Martha Jensen participated in the discussions, expressed understanding, and voiced agreement with the above plans.  All questions were answered to her satisfaction. she is encouraged to contact clinic should she have any questions or concerns prior to her return visit.    Follow up plan: - Return in about 4 months (around 07/02/2021) for Bring Meter and Logs- A1c in Office.  Glade Lloyd, MD [facility] [address] [place], [place] [address] Phone: [phone]  Fax: [phone]    03/04/2021, 11:23 AM  This note was partially dictated with voice recognition software. Similar sounding words can be transcribed inadequately or may not  be corrected upon review.

## 2021-03-04 NOTE — Assessment & Plan Note (Signed)
With mood symptoms since CVA Could be residual emotional effects from CVA versus adjustment disorder Referred to [facility] therapy for now

## 2021-03-04 NOTE — Assessment & Plan Note (Signed)
Lab Results  Component Value Date   HGBA1C 6.7 03/04/2021    On Metformin and Farxiga, followed by Endocrinology Advised to follow diabetic diet On statin and ACEi

## 2021-03-04 NOTE — Assessment & Plan Note (Signed)
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

## 2021-03-04 NOTE — Assessment & Plan Note (Addendum)
BP Readings from Last 1 Encounters:  03/04/21 (!) 142/84   uncontrolled with Lisinopril, likely elevated due to anxiety Counseled for compliance with the medications Advised DASH diet and moderate exercise/walking, at least 150 mins/week

## 2021-03-04 NOTE — Patient Instructions (Signed)

## 2021-03-18 ENCOUNTER — Telehealth: Payer: Self-pay | Admitting: *Deleted

## 2021-03-18 NOTE — Chronic Care Management (AMB) (Signed)
?  Care Management  ? ?Outreach Note ? ?03/18/2021 ?Name: Martha Jensen MRN: [id number] DOB: [birth year] ? ?Referred by: No primary care provider on file. ?Reason for referral : Care Coordination (Initial outreach to schedule referral with SW ) ? ? ?An unsuccessful telephone outreach was attempted today. The patient was referred to the case management team for assistance with care management and care coordination.  ? ?Follow Up Plan:  ?The care management team will reach out to the patient again over the next 1 days.  ?If patient returns call to provider office, please advise to call [place]* at [phone].* ? ?Laverda Sorenson  ?Care Guide, Embedded Care Coordination ?[place]  Care Management  ?Direct Dial: [phone] ? ?

## 2021-03-19 NOTE — Chronic Care Management (AMB) (Signed)
?  Care Management  ? ?Note ? ?03/19/2021 ?Name: Martha Jensen MRN: [id number] DOB: 1960/05/19 ? ?Martha Jensen is a 62 y.o. year old female who is a primary care patient of No primary care provider on file.. I reached out to Merian Capron by phone today in response to a referral sent by [name] primary care provider.  ? ?[name] was given information about care management services today including:  ?Care management services include personalized support from designated clinical staff supervised by her physician, including individualized plan of care and coordination with other care providers ?24/7 contact phone numbers for assistance for urgent and routine care needs. ?The patient may stop care management services at any time by phone call to the office staff. ? ?Patient did not agree to enrollment in care management services and does not wish to consider at this time do to changing Primary Care Physicians at this time. . ? ?Follow up plan: ?Dr. Posey Pronto notified via routed documentation in medical record.  ? ?Laverda Sorenson  ?Care Guide, Embedded Care Coordination ?[place]  Care Management  ?Direct Dial: [phone] ? ?

## 2021-03-27 ENCOUNTER — Other Ambulatory Visit: Payer: Self-pay | Admitting: Neurology

## 2021-04-02 DIAGNOSIS — I10 Essential (primary) hypertension: Secondary | ICD-10-CM | POA: Diagnosis not present

## 2021-04-02 DIAGNOSIS — Z8673 Personal history of transient ischemic attack (TIA), and cerebral infarction without residual deficits: Secondary | ICD-10-CM | POA: Diagnosis not present

## 2021-04-02 DIAGNOSIS — E1165 Type 2 diabetes mellitus with hyperglycemia: Secondary | ICD-10-CM | POA: Diagnosis not present

## 2021-04-02 DIAGNOSIS — I4819 Other persistent atrial fibrillation: Secondary | ICD-10-CM | POA: Diagnosis not present

## 2021-04-15 ENCOUNTER — Ambulatory Visit: Payer: Federal, State, Local not specified - PPO | Admitting: Internal Medicine

## 2021-04-20 ENCOUNTER — Other Ambulatory Visit: Payer: Self-pay | Admitting: "Endocrinology

## 2021-04-20 DIAGNOSIS — E1165 Type 2 diabetes mellitus with hyperglycemia: Secondary | ICD-10-CM

## 2021-06-02 DIAGNOSIS — Z0289 Encounter for other administrative examinations: Secondary | ICD-10-CM

## 2021-07-02 ENCOUNTER — Ambulatory Visit: Payer: Federal, State, Local not specified - PPO | Admitting: "Endocrinology

## 2021-07-02 ENCOUNTER — Other Ambulatory Visit: Payer: Self-pay | Admitting: "Endocrinology

## 2021-07-02 DIAGNOSIS — E1165 Type 2 diabetes mellitus with hyperglycemia: Secondary | ICD-10-CM

## 2021-07-30 ENCOUNTER — Ambulatory Visit: Payer: Federal, State, Local not specified - PPO | Admitting: "Endocrinology

## 2021-08-13 ENCOUNTER — Ambulatory Visit: Payer: Federal, State, Local not specified - PPO | Admitting: "Endocrinology

## 2021-08-13 ENCOUNTER — Encounter: Payer: Self-pay | Admitting: "Endocrinology

## 2021-08-13 VITALS — BP 118/86 | HR 84 | Ht 62.0 in | Wt 232.0 lb

## 2021-08-13 DIAGNOSIS — I10 Essential (primary) hypertension: Secondary | ICD-10-CM

## 2021-08-13 DIAGNOSIS — E782 Mixed hyperlipidemia: Secondary | ICD-10-CM

## 2021-08-13 DIAGNOSIS — E1165 Type 2 diabetes mellitus with hyperglycemia: Secondary | ICD-10-CM

## 2021-08-13 LAB — POCT GLYCOSYLATED HEMOGLOBIN (HGB A1C): HbA1c, POC (controlled diabetic range): 8 % — AB (ref 0.0–7.0)

## 2021-08-13 MED ORDER — TIRZEPATIDE 2.5 MG/0.5ML ~~LOC~~ SOAJ: 2.5000 mg | SUBCUTANEOUS | 2 refills | Status: DC

## 2021-08-13 NOTE — Progress Notes (Signed)
08/13/2021, 12:19 PM  Endocrinology follow-up note   Subjective:    Patient ID: Martha Jensen, female    DOB: 11-Jun-1960.  Martha Jensen is being seen in follow-up after he was seen in consultation for management of currently uncontrolled symptomatic diabetes requested by  Noreene Larsson, NP.   Past Medical History:  Diagnosis Date   Diabetes mellitus without complication ([place])    Hyperlipidemia    Hypertension    Stroke [facility])    Has blurred vision    Past Surgical History:  Procedure Laterality Date   TUBAL LIGATION      Social History   Socioeconomic History   Marital status: Divorced    Spouse name: Not on file   Number of children: Not on file   Years of education: Not on file   Highest education level: Not on file  Occupational History   Occupation: [place] to appointments    Comment: full time  Tobacco Use   Smoking status: Never   Smokeless tobacco: Never  Vaping Use   Vaping Use: Never used  Substance and Sexual Activity   Alcohol use: Not Currently   Drug use: Never   Sexual activity: Not Currently    Birth control/protection: Surgical    Comment: tubal  Other Topics Concern   Not on file  Social History Narrative   Lives alone   Right Handed   Drinks caffeine 3xweek   Social Determinants of Health   Financial Resource Strain: Low Risk  (06/10/2020)   Overall Financial Resource Strain (CARDIA)    Difficulty of Paying Living Expenses: Not hard at all  Food Insecurity: No Food Insecurity (06/10/2020)   Hunger Vital Sign    Worried About Running Out of Food in the Last Year: Never true    [place] in the Last Year: Never true  Transportation Needs: No Transportation Needs (06/10/2020)   PRAPARE - Hydrologist (Medical): No    Lack of Transportation (Non-Medical): No  Physical Activity: Insufficiently Active  (06/10/2020)   Exercise Vital Sign    Days of Exercise per Week: 4 days    Minutes of Exercise per Session: 10 min  Stress: No Stress Concern Present (06/10/2020)   [place]    Feeling of Stress : Not at all  Social Connections: Moderately Isolated (06/10/2020)   Social Connection and Isolation Panel [NHANES]    Frequency of Communication with Friends and Family: More than three times a week    Frequency of Social Gatherings with Friends and Family: Once a week    Attends Religious Services: 1 to 4 times per year    Active Member of [employer] or Organizations: No    Attends Archivist Meetings: Never    Marital Status: Widowed    Family History  Problem Relation Age of Onset   Heart attack Brother     Outpatient Encounter Medications as of 08/13/2021  Medication Sig   tirzepatide (MOUNJARO) 2.5 MG/0.5ML Pen Inject 2.5 mg into the skin once a week.   amLODipine (NORVASC) 10 MG tablet Take  1 tablet (10 mg total) by mouth daily.   apixaban (ELIQUIS) 5 MG TABS tablet Take 5 mg by mouth 2 (two) times daily.   Aspirin 81 MG CAPS Take 1 capsule by mouth daily.   blood glucose meter kit and supplies Dispense based on patient and insurance preference. Use up to four times daily as directed. (FOR ICD-10 E10.9, E11.9).   CALCIUM PO Take by mouth at bedtime.   hydrALAZINE (APRESOLINE) 25 MG tablet Take 1 tablet (25 mg total) by mouth 3 (three) times daily.   Lancets (ONETOUCH ULTRASOFT) lancets Use as instructed, can use up to 4 times per day.   lisinopril (ZESTRIL) 40 MG tablet Take 1 tablet (40 mg total) by mouth daily.   metFORMIN (GLUCOPHAGE-XR) 500 MG 24 hr tablet Take 1 tablet (500 mg total) by mouth daily with breakfast.   ONETOUCH VERIO test strip TEST GLUCOSE 4 TIMES PER DAY   progesterone (PROMETRIUM) 100 MG capsule Take 1 capsule (100 mg total) by mouth daily.   rosuvastatin (CRESTOR) 40 MG tablet Take 1 tablet  (40 mg total) by mouth daily.   [DISCONTINUED] dapagliflozin propanediol (FARXIGA) 5 MG TABS tablet Take 5 mg by mouth daily.   No facility-administered encounter medications on file as of 08/13/2021.    ALLERGIES: No Known Allergies  VACCINATION STATUS: Immunization History  Administered Date(s) Administered   PFIZER(Purple Top)SARS-COV-2 Vaccination 10/10/2019, 10/31/2019   Pneumococcal Polysaccharide-23 04/04/2020   Diabetes She presents for her follow-up diabetic visit. She has type 2 diabetes mellitus. Onset time: Patient was diagnosed at approximate age of 59 years. Her disease course has been worsening. There are no hypoglycemic associated symptoms. Pertinent negatives for hypoglycemia include no confusion, headaches, pallor or seizures. Pertinent negatives for diabetes include no blurred vision, no chest pain, no fatigue, no polydipsia, no polyphagia and no polyuria. Symptoms are worsening. Diabetic complications include a CVA. Risk factors for coronary artery disease include diabetes mellitus, dyslipidemia, hypertension, obesity, family history, sedentary lifestyle and post-menopausal. Current diabetic treatments: She is currently on Metformin 500 mg p.o. twice daily, Farxiga 10 mg p.o. daily. Her weight is stable. She is following a generally unhealthy diet. When asked about meal planning, she reported none. She has not had a previous visit with a dietitian. She rarely participates in exercise. Her home blood glucose trend is increasing steadily. Her breakfast blood glucose range is generally 140-180 mg/dl. Her bedtime blood glucose range is generally 180-200 mg/dl. Her overall blood glucose range is 180-200 mg/dl. (She presents with worsening glycemic profile compared to last visit.  Her point-of-care was 88% increasing from 6.7% during her last visit.  However, it is overall improving from 16% A1c in 01/26/2020.   She did not document or report any hypoglycemia.  She reports some side  effects on the skin nodules/tenderness on bilateral thighs.  She associates this with the start of her [place].    ) An ACE inhibitor/angiotensin II receptor blocker is being taken. Eye exam is current.     Review of Systems  Constitutional:  Negative for chills, fatigue, fever and unexpected weight change.  HENT:  Negative for trouble swallowing and voice change.   Eyes:  Negative for blurred vision and visual disturbance.  Respiratory:  Negative for cough, shortness of breath and wheezing.   Cardiovascular:  Negative for chest pain, palpitations and leg swelling.  Gastrointestinal:  Negative for diarrhea, nausea and vomiting.  Endocrine: Negative for cold intolerance, heat intolerance, polydipsia, polyphagia and polyuria.  Musculoskeletal:  Negative for  arthralgias and myalgias.  Skin:  Negative for color change, pallor, rash and wound.  Neurological:  Negative for seizures and headaches.  Psychiatric/Behavioral:  Negative for confusion and suicidal ideas.     Objective:       08/13/2021    9:22 AM 03/04/2021    1:25 PM 03/04/2021    1:20 PM  Vitals with BMI  Height 5' 2"   5' 2"   Weight 232 lbs  219 lbs 2 oz  BMI 38.75  64.33  Systolic 295 188 416  Diastolic 86 84 606  Pulse 84  94    BP 118/86   Pulse 84   Ht 5' 2"  (1.575 m)   Wt 232 lb (105.2 kg)   BMI 42.43 kg/m   Wt Readings from Last 3 Encounters:  08/13/21 232 lb (105.2 kg)  03/04/21 219 lb 1.9 oz (99.4 kg)  03/04/21 218 lb (98.9 kg)     Examination of her thighs did not show rash or active lesion.  No skin color change.  Patient feels some tenderness in the deep tissues bilaterally.  CMP ( most recent) CMP     Component Value Date/Time   NA 141 01/06/2021 1002   K 4.2 01/06/2021 1002   CL 107 (H) 01/06/2021 1002   CO2 21 01/06/2021 1002   GLUCOSE 109 (H) 01/06/2021 1002   BUN 12 01/06/2021 1002   CREATININE 0.80 01/06/2021 1002   CALCIUM 9.1 01/06/2021 1002   PROT 6.9 01/06/2021 1002   ALBUMIN 4.4  01/06/2021 1002   AST 18 01/06/2021 1002   ALT 11 01/06/2021 1002   ALKPHOS 76 01/06/2021 1002   BILITOT 0.4 01/06/2021 1002     Diabetic Labs (most recent): Lab Results  Component Value Date   HGBA1C 8.0 (A) 08/13/2021   HGBA1C 6.7 03/04/2021   HGBA1C 6.6 09/01/2020    Lipid Panel     Component Value Date/Time   CHOL 208 (H) 01/06/2021 1002   TRIG 81 01/06/2021 1002   HDL 63 01/06/2021 1002   CHOLHDL 2.5 04/08/2020 1407   LDLCALC 131 (H) 01/06/2021 1002   LABVLDL 14 01/06/2021 1002      Lab Results  Component Value Date   TSH 1.010 04/08/2020      Assessment & Plan:   1. Uncontrolled type 2 diabetes mellitus with hyperglycemia (HCC)  - Martha Jensen has currently uncontrolled symptomatic type 2 DM since  61 years of age.  She presents with worsening glycemic profile compared to last visit.  Her point-of-care was 88% increasing from 6.7% during her last visit.  However, it is overall improving from 16% A1c in 01/26/2020.   She did not document or report any hypoglycemia.  She reports some side effects on the skin nodules/tenderness on bilateral thighs.  She associates this with the start of her [place].     Recent labs reviewed. - I had a long discussion with her about the progressive nature of diabetes and the pathology behind its complications. -her diabetes is complicated by CVA, obesity/sedentary life and she remains at a high risk for more acute and chronic complications which include CAD, CVA, CKD, retinopathy, and neuropathy. These are all discussed in detail with her.  - I have counseled her on diet  and weight management  by adopting a carbohydrate restricted/protein rich diet. Patient is encouraged to switch to  unprocessed or minimally processed     complex starch and increased protein intake (animal or plant source), fruits, and vegetables. -  she is advised to stick to a routine mealtimes to eat 3 meals  a day and avoid unnecessary snacks ( to snack only  to correct hypoglycemia).   -This patient particularly has problems with medications.  She is a good candidate for lifestyle medicine.  - she acknowledges that there is a room for improvement in her food and drink choices. - Suggestion is made for her to avoid simple carbohydrates  from her diet including Cakes, Sweet Desserts, Ice Cream, Soda (diet and regular), Sweet Tea, Candies, Chips, Cookies, Store Bought Juices, Alcohol , Artificial Sweeteners,  Coffee Creamer, and "Sugar-free" Products, Lemonade. This will help patient to have more stable blood glucose profile and potentially avoid unintended weight gain.  The following Lifestyle Medicine recommendations according to [place]) were discussed and and offered to patient and she  agrees to start the journey:  A. Whole Foods, Plant-Based Nutrition comprising of fruits and vegetables, plant-based proteins, whole-grain carbohydrates was discussed in detail with the patient.   A list for source of those nutrients were also provided to the patient.  Patient will use only water or unsweetened tea for hydration. B.  The need to stay away from risky substances including alcohol, smoking; obtaining 7 to 9 hours of restorative sleep, at least 150 minutes of moderate intensity exercise weekly, the importance of healthy social connections,  and stress management techniques were discussed. C.  A full color page of  Calorie density of various food groups per pound showing examples of each food groups was provided to the patient.   - she will be scheduled with Jearld Fenton, RDN, CDE for diabetes education.  - I have approached her with the following individualized plan to manage  her diabetes and patient agrees:   -Based on her presentation with above target glycemic profile, she will need more medications.   -In light of her skin complaints described above, she will be taken off of [place]. She is advised to continue  her metformin 500 mg XR p.o. daily at breakfast.  I discussed and initiated Mounjaro 2.5 mg subcutaneously weekly, to advance as tolerated. - She is informed of side effects from these medications.  she is encouraged to continue monitoring blood glucose twice a day-daily before breakfast and at bedtime and call clinic for blood glucose levels less than 70 or above 200 mg /dl.  - Specific targets for  A1c;  LDL, HDL,  and Triglycerides were discussed with the patient.  2) Blood Pressure /Hypertension:  -Her blood pressure is controlled to target.  she is advised to continue her current medications including lisinopril 20 mg p.o. daily at breakfast. .    3) Lipids/Hyperlipidemia:   Review of her recent lipid panel showed  controlled  LDL at 61 .  she  is advised to continue Lipitor 80 mg p.o. daily at bedtime and side effects and precautions discussed with her.     4)  Weight/Diet:  Body mass index is 42.43 kg/m.  -   clearly complicating her diabetes care.   she is  a candidate for weight loss. I discussed with her the fact that loss of 5 - 10% of her  current body weight will have the most impact on her diabetes management.  Exercise, and detailed carbohydrates information provided  -  detailed on discharge instructions.  5) Chronic Care/Health Maintenance:  -she  is on ACEI/ARB and Statin medications and  is encouraged to initiate and continue to follow  up with Ophthalmology, Dentist,  Podiatrist at least yearly or according to recommendations, and advised to   stay away from smoking. I have recommended yearly flu vaccine and pneumonia vaccine at least every 5 years; moderate intensity exercise for up to 150 minutes weekly; and  sleep for at least 7 hours a day.   Her screening ABI was negative for PAD in April 2022.  This study will be repeated in April 2027 or sooner if needed.   - she is  advised to maintain close follow up with Noreene Larsson, NP for primary care needs, as well as her  other providers for optimal and coordinated care.   I spent 41 minutes in the care of the patient today including review of labs from [place], Lipids, Thyroid Function, Hematology (current and previous including abstractions from other facilities); face-to-face time discussing  her blood glucose readings/logs, discussing hypoglycemia and hyperglycemia episodes and symptoms, medications doses, her options of short and long term treatment based on the latest standards of care / guidelines;  discussion about incorporating lifestyle medicine;  and documenting the encounter. Risk reduction counseling performed per USPSTF guidelines to reduce obesity and cardiovascular risk factors.     Please refer to Patient Instructions for Blood Glucose Monitoring and Insulin/Medications Dosing Guide"  in media tab for additional information. Please  also refer to " Patient Self Inventory" in the Media  tab for reviewed elements of pertinent patient history.  Merian Capron participated in the discussions, expressed understanding, and voiced agreement with the above plans.  All questions were answered to her satisfaction. she is encouraged to contact clinic should she have any questions or concerns prior to her return visit.     Follow up plan: - Return in about 3 months (around 11/13/2021) for F/U with Pre-visit Labs, Meter/CGM/Logs, A1c here.  Glade Lloyd, MD [facility] [address] [place], [place] [address] Phone: [phone]  Fax: [phone]    08/13/2021, 12:19 PM  This note was partially dictated with voice recognition software. Similar sounding words can be transcribed inadequately or may not  be corrected upon review.

## 2021-08-13 NOTE — Patient Instructions (Signed)
                                     Advice for Weight Management  -For most of us the best way to lose weight is by diet management. Generally speaking, diet management means consuming less calories intentionally which over time brings about progressive weight loss.  This can be achieved more effectively by avoiding ultra processed carbohydrates, processed meats, unhealthy fats.    It is critically important to know your numbers: how much calorie you are consuming and how much calorie you need. More importantly, our carbohydrates sources should be unprocessed naturally occurring  complex starch food items.  It is always important to balance nutrition also by  appropriate intake of proteins (mainly plant-based), healthy fats/oils, plenty of fruits and vegetables.   -The American College of Lifestyle Medicine (ACL M) recommends nutrition derived mostly from Whole Food, Plant Predominant Sources example an apple instead of applesauce or apple pie. Eat Plenty of vegetables, Mushrooms, fruits, Legumes, Whole Grains, Nuts, seeds in lieu of processed meats, processed snacks/pastries red meat, poultry, eggs.  Use only water or unsweetened tea for hydration.  The College also recommends the need to stay away from risky substances including alcohol, smoking; obtaining 7-9 hours of restorative sleep, at least 150 minutes of moderate intensity exercise weekly, importance of healthy social connections, and being mindful of stress and seek help when it is overwhelming.    -Sticking to a routine mealtime to eat 3 meals a day and avoiding unnecessary snacks is shown to have a big role in weight control. Under normal circumstances, the only time we burn stored energy is when we are hungry, so allow  some hunger to take place- hunger means no food between appropriate meal times, only water.  It is not advisable to starve.   -It is better to avoid simple carbohydrates including:  Cakes, Sweet Desserts, Ice Cream, Soda (diet and regular), Sweet Tea, Candies, Chips, Cookies, Store Bought Juices, Alcohol in Excess of  1-2 drinks a day, Lemonade,  Artificial Sweeteners, Doughnuts, Coffee Creamers, "Sugar-free" Products, etc, etc.  This is not a complete list.....    -Consulting with certified diabetes educators is proven to provide you with the most accurate and current information on diet.  Also, you may be  interested in discussing diet options/exchanges , we can schedule a visit with Martha Jensen, RDN, CDE for individualized nutrition education.  -Exercise: If you are able: 30 -60 minutes a day ,4 days a week, or 150 minutes of moderate intensity exercise weekly.    The longer the better if tolerated.  Combine stretch, strength, and aerobic activities.  If you were told in the past that you have high risk for cardiovascular diseases, or if you are currently symptomatic, you may seek evaluation by your heart doctor prior to initiating moderate to intense exercise programs.                                  Additional Care Considerations for Diabetes/Prediabetes   -Diabetes  is a chronic disease.  The most important care consideration is regular follow-up with your diabetes care provider with the goal being avoiding or delaying its complications and to take advantage of advances in medications and technology.  If appropriate actions are taken early enough, type 2 diabetes can even be   reversed.  Seek information from the right source.  - Whole Food, Plant Predominant Nutrition is highly recommended: Eat Plenty of vegetables, Mushrooms, fruits, Legumes, Whole Grains, Nuts, seeds in lieu of processed meats, processed snacks/pastries red meat, poultry, eggs as recommended by American College of  Lifestyle Medicine (ACLM).  -Type 2 diabetes is known to coexist with other important comorbidities such as high blood pressure and high cholesterol.  It is critical to control not only the  diabetes but also the high blood pressure and high cholesterol to minimize and delay the risk of complications including coronary artery disease, stroke, amputations, blindness, etc.  The good news is that this diet recommendation for type 2 diabetes is also very helpful for managing high cholesterol and high blood blood pressure.  - Studies showed that people with diabetes will benefit from a class of medications known as ACE inhibitors and statins.  Unless there are specific reasons not to be on these medications, the standard of care is to consider getting one from these groups of medications at an optimal doses.  These medications are generally considered safe and proven to help protect the heart and the kidneys.    - People with diabetes are encouraged to initiate and maintain regular follow-up with eye doctors, foot doctors, dentists , and if necessary heart and kidney doctors.     - It is highly recommended that people with diabetes quit smoking or stay away from smoking, and get yearly  flu vaccine and pneumonia vaccine at least every 5 years.  See above for additional recommendations on exercise, sleep, stress management , and healthy social connections.      

## 2021-08-14 ENCOUNTER — Telehealth: Payer: Self-pay

## 2021-08-14 NOTE — Telephone Encounter (Signed)
PA for [facility] sent in to insurance company through [employer].

## 2021-08-17 ENCOUNTER — Telehealth: Payer: Self-pay

## 2021-08-17 ENCOUNTER — Other Ambulatory Visit: Payer: Self-pay | Admitting: "Endocrinology

## 2021-08-17 MED ORDER — TRULICITY 1.5 MG/0.5ML ~~LOC~~ SOAJ: 1.5000 mg | SUBCUTANEOUS | 2 refills | Status: AC

## 2021-08-17 NOTE — Telephone Encounter (Signed)
Tried to call pt regarding change in Rx from [facility] to Trulicity, did not receive an answer.

## 2021-09-05 ENCOUNTER — Other Ambulatory Visit: Payer: Self-pay | Admitting: "Endocrinology

## 2021-11-13 ENCOUNTER — Ambulatory Visit: Payer: Federal, State, Local not specified - PPO | Admitting: "Endocrinology

## 2021-12-07 ENCOUNTER — Telehealth: Payer: Self-pay

## 2021-12-07 NOTE — Telephone Encounter (Signed)
Pt called stating she has an appt tomorrow but needs to reschedule her appointment. Contact # [phone]

## 2021-12-08 ENCOUNTER — Ambulatory Visit: Payer: Federal, State, Local not specified - PPO | Admitting: "Endocrinology

## 2021-12-08 NOTE — Telephone Encounter (Signed)
[place] tried to call pt but did not get an answer. Pt's appointment cancelled as requested.

## 2021-12-25 DIAGNOSIS — S161XXA Strain of muscle, fascia and tendon at neck level, initial encounter: Secondary | ICD-10-CM | POA: Diagnosis not present

## 2021-12-25 DIAGNOSIS — S29019A Strain of muscle and tendon of unspecified wall of thorax, initial encounter: Secondary | ICD-10-CM | POA: Diagnosis not present

## 2022-03-11 DIAGNOSIS — R059 Cough, unspecified: Secondary | ICD-10-CM | POA: Diagnosis not present

## 2022-03-11 DIAGNOSIS — R062 Wheezing: Secondary | ICD-10-CM | POA: Diagnosis not present

## 2022-03-11 DIAGNOSIS — J40 Bronchitis, not specified as acute or chronic: Secondary | ICD-10-CM | POA: Diagnosis not present

## 2022-08-09 DIAGNOSIS — L578 Other skin changes due to chronic exposure to nonionizing radiation: Secondary | ICD-10-CM | POA: Diagnosis not present

## 2022-08-09 DIAGNOSIS — L814 Other melanin hyperpigmentation: Secondary | ICD-10-CM | POA: Diagnosis not present

## 2022-08-09 DIAGNOSIS — L821 Other seborrheic keratosis: Secondary | ICD-10-CM | POA: Diagnosis not present

## 2022-08-09 DIAGNOSIS — D1801 Hemangioma of skin and subcutaneous tissue: Secondary | ICD-10-CM | POA: Diagnosis not present
